# Patient Record
Sex: Female | Born: 1970 | Race: White | Hispanic: No | Marital: Married | Smoking: Never smoker
Health system: Southern US, Community
[De-identification: ages and names within clinical notes are randomized; demographics above are authoritative.]

## PROBLEM LIST (undated history)

## (undated) DIAGNOSIS — E039 Hypothyroidism, unspecified: Secondary | ICD-10-CM

## (undated) DIAGNOSIS — F319 Bipolar disorder, unspecified: Secondary | ICD-10-CM

## (undated) HISTORY — PX: TONSILLECTOMY AND ADENOIDECTOMY: SUR1326

---

## 2017-10-14 ENCOUNTER — Encounter: Payer: Self-pay | Admitting: Emergency Medicine

## 2017-10-14 ENCOUNTER — Other Ambulatory Visit: Payer: Self-pay

## 2017-10-14 ENCOUNTER — Emergency Department
Admission: EM | Admit: 2017-10-14 | Discharge: 2017-10-14 | Disposition: A | Discharge status: Home / Self Care | Payer: BLUE CROSS/BLUE SHIELD | Attending: Emergency Medicine | Admitting: Emergency Medicine

## 2017-10-14 DIAGNOSIS — R531 Weakness: Secondary | ICD-10-CM | POA: Diagnosis present

## 2017-10-14 DIAGNOSIS — E039 Hypothyroidism, unspecified: Secondary | ICD-10-CM | POA: Diagnosis not present

## 2017-10-14 LAB — URINALYSIS, COMPLETE (UACMP) WITH MICROSCOPIC
BILIRUBIN URINE: NEGATIVE
Glucose, UA: NEGATIVE mg/dL
HGB URINE DIPSTICK: NEGATIVE
KETONES UR: 5 mg/dL — AB
LEUKOCYTES UA: NEGATIVE
NITRITE: NEGATIVE
PH: 5 (ref 5.0–8.0)
Protein, ur: NEGATIVE mg/dL
SPECIFIC GRAVITY, URINE: 1.027 (ref 1.005–1.030)

## 2017-10-14 LAB — COMPREHENSIVE METABOLIC PANEL
ALT: 21 U/L (ref 14–54)
ANION GAP: 10 (ref 5–15)
AST: 31 U/L (ref 15–41)
Albumin: 4.1 g/dL (ref 3.5–5.0)
Alkaline Phosphatase: 69 U/L (ref 38–126)
BUN: 20 mg/dL (ref 6–20)
CALCIUM: 9.4 mg/dL (ref 8.9–10.3)
CHLORIDE: 100 mmol/L — AB (ref 101–111)
CO2: 26 mmol/L (ref 22–32)
CREATININE: 0.81 mg/dL (ref 0.44–1.00)
Glucose, Bld: 125 mg/dL — ABNORMAL HIGH (ref 65–99)
Potassium: 4.7 mmol/L (ref 3.5–5.1)
SODIUM: 136 mmol/L (ref 135–145)
Total Bilirubin: 0.8 mg/dL (ref 0.3–1.2)
Total Protein: 7.2 g/dL (ref 6.5–8.1)

## 2017-10-14 LAB — CBC
HCT: 37.7 % (ref 35.0–47.0)
Hemoglobin: 13.5 g/dL (ref 12.0–16.0)
MCH: 30.6 pg (ref 26.0–34.0)
MCHC: 35.8 g/dL (ref 32.0–36.0)
MCV: 85.3 fL (ref 80.0–100.0)
PLATELETS: 346 10*3/uL (ref 150–440)
RBC: 4.42 MIL/uL (ref 3.80–5.20)
RDW: 13.1 % (ref 11.5–14.5)
WBC: 13.7 10*3/uL — ABNORMAL HIGH (ref 3.6–11.0)

## 2017-10-14 LAB — TSH: TSH: 10.32 u[IU]/mL — ABNORMAL HIGH (ref 0.350–4.500)

## 2017-10-14 LAB — INFLUENZA PANEL BY PCR (TYPE A & B)
INFLAPCR: NEGATIVE
Influenza B By PCR: NEGATIVE

## 2017-10-14 NOTE — ED Triage Notes (Addendum)
Pt in with generalized weakness since Friday. States not able to drink or eat since then, states just loss of appetite. Pt denies any n,v,d or dysuria.

## 2017-10-14 NOTE — Discharge Instructions (Signed)
Your lab workup shows that you have hypothyroidism.  You should call your doctor tomorrow to schedule a follow-up appointment within the next 1-2 weeks so that you can get any other necessary workup to determine the cause, and to start treatment.  Return to the emergency department immediately for new, worsening, or persistent weakness, lightheadedness, passing out, difficulty breathing, chest pain, inability to hold anything down by mouth, or any other new or worsening symptoms that concern you.

## 2017-10-14 NOTE — ED Provider Notes (Signed)
[facility] Emergency Department Provider Note ____________________________________________   First MD Initiated Contact with Patient 10/14/17 2048     (approximate)  I have reviewed the triage vital signs and the nursing notes.   HISTORY  Chief Complaint Weakness    HPI Lindsey Sexton is a 47 y.o. female with PMH of hypertension who presents with generalized weakness over the last 3-4 days, gradual onset, associated with decreased appetite, but not associated with any other specific symptoms.  Patient denies sick contacts or other recent illness.  She denies fever, vomiting, abdominal pain, chest pain, difficulty breathing, or other acute symptoms.  No recent changes in medications.  No past medical history on file.  There are no active problems to display for this patient.    Prior to Admission medications   Not on File    Allergies Patient has no known allergies.  No family history on file.  Social History Social History   Tobacco Use  . Smoking status: Not on file  Substance Use Topics  . Alcohol use: Not on file  . Drug use: Not on file    Review of Systems  Constitutional: Positive for generalized weakness. Eyes: No redness. ENT: No sore throat. Cardiovascular: Denies chest pain. Respiratory: Denies shortness of breath. Gastrointestinal: No nausea, no vomiting.  No diarrhea.  Genitourinary: Negative for dysuria.  Musculoskeletal: Negative for back pain. Skin: Negative for rash. Neurological: Negative for headache.   ____________________________________________   PHYSICAL EXAM:  VITAL SIGNS: ED Triage Vitals  Enc Vitals Group     BP 10/14/17 1956 (!) 141/82     Pulse Rate 10/14/17 1956 91     Resp 10/14/17 1956 18     Temp 10/14/17 1956 98.6 F (37 C)     Temp Source 10/14/17 1956 Oral     SpO2 10/14/17 1956 100 %     Weight 10/14/17 1958 190 lb (86.2 kg)     Height 10/14/17 1958 5\' 5"  (1.651 m)     Head  Circumference --      Peak Flow --      Pain Score 10/14/17 2033 6     Pain Loc --      Pain Edu? --      Excl. in GC? --     Constitutional: Alert and oriented. Well appearing and in no acute distress. Eyes: Conjunctivae are normal.  EOMI. Head: Atraumatic. Nose: No congestion/rhinnorhea. Mouth/Throat: Mucous membranes are moist.   Neck: Normal range of motion.  Cardiovascular: Normal rate, regular rhythm. Grossly normal heart sounds.  Good peripheral circulation. Respiratory: Normal respiratory effort.  No retractions. Lungs CTAB. Gastrointestinal: Soft and nontender. No distention.  Genitourinary: No flank tenderness. Musculoskeletal: Bilateral mild non-pitting lower extremity edema.  Extremities warm and well perfused.  Neurologic:  Normal speech and language. No gross focal neurologic deficits are appreciated.  Skin:  Skin is warm and dry. No rash noted. Psychiatric: Mood and affect are normal. Speech and behavior are normal.  ____________________________________________   LABS (all labs ordered are listed, but only abnormal results are displayed)  Labs Reviewed  CBC - Abnormal; Notable for the following components:      Result Value   WBC 13.7 (*)    All other components within normal limits  COMPREHENSIVE METABOLIC PANEL - Abnormal; Notable for the following components:   Chloride 100 (*)    Glucose, Bld 125 (*)    All other components within normal limits  URINALYSIS, COMPLETE (UACMP) WITH MICROSCOPIC -  Abnormal; Notable for the following components:   Color, Urine AMBER (*)    APPearance HAZY (*)    Ketones, ur 5 (*)    Bacteria, UA RARE (*)    Squamous Epithelial / LPF 0-5 (*)    All other components within normal limits  TSH - Abnormal; Notable for the following components:   TSH 10.320 (*)    All other components within normal limits  INFLUENZA PANEL BY PCR (TYPE A & B)    ____________________________________________  EKG   ____________________________________________  RADIOLOGY    ____________________________________________   PROCEDURES  Procedure(s) performed: No  Procedures  Critical Care performed: No ____________________________________________   INITIAL IMPRESSION / ASSESSMENT AND PLAN / ED COURSE  Pertinent labs & imaging results that were available during my care of the patient were reviewed by me and considered in my medical decision making (see chart for details).  47 year old female with history of hypertension presents with generalized weakness/malaise over the last 3-4 days, with decreased appetite but no other focal symptoms.  No relevant past medical records available in Epic.  On exam, vital signs are normal, the patient is well-appearing, and the remainder the exam is unremarkable.  There is no clinical evidence for dehydration.  Initial labs reveal an elevated white blood cell count but are otherwise unremarkable.  Differential includes most likely nonspecific viral infection, influenza, or less likely endocrine cause such as hypothyroidism.  There is no evidence of anemia, electrolyte abnormality, or other metabolic cause.  No clinical evidence for cardiac cause or indication for cardiac workup.  We will add on a TSH and influenza swab and reassess.  Anticipate discharge home if negative.    ----------------------------------------- 11:00 PM on 10/14/2017 -----------------------------------------  Patient's flu is negative but her TSH is elevated, suggesting hypothyroidism which would correspond to the patient's symptoms.  At this time, given that the patient is stable and there is no evidence of a hypothyroid crisis, there is no indication to start acute treatment.  Patient states that she has a primary care doctor and will be able to schedule an appointment without difficulty.  Therefore I instructed her to call  tomorrow and schedule an appointment.  This will facilitate the patient getting any other necessary workup to determine the cause as an outpatient, and then initiate the appropriate treatment.  I discussed the results of the workup and the plan of care with the patient, as well as return precautions; she expressed understanding and agreement.  ____________________________________________   FINAL CLINICAL IMPRESSION(S) / ED DIAGNOSES  Final diagnoses:  Hypothyroidism, unspecified type      NEW MEDICATIONS STARTED DURING THIS VISIT:  New Prescriptions   No medications on file     Note:  This document was prepared using Dragon voice recognition software and may include unintentional dictation errors.    Dionne Bucy, MD 10/14/17 2302

## 2018-03-03 ENCOUNTER — Encounter: Payer: Self-pay | Admitting: Emergency Medicine

## 2018-03-03 ENCOUNTER — Emergency Department: Payer: BLUE CROSS/BLUE SHIELD

## 2018-03-03 ENCOUNTER — Observation Stay
Admission: EM | Admit: 2018-03-03 | Discharge: 2018-03-04 | Disposition: A | Discharge status: Home / Self Care | Payer: BLUE CROSS/BLUE SHIELD | Attending: Internal Medicine | Admitting: Internal Medicine

## 2018-03-03 DIAGNOSIS — R410 Disorientation, unspecified: Secondary | ICD-10-CM | POA: Insufficient documentation

## 2018-03-03 DIAGNOSIS — F319 Bipolar disorder, unspecified: Secondary | ICD-10-CM

## 2018-03-03 DIAGNOSIS — F23 Brief psychotic disorder: Secondary | ICD-10-CM | POA: Diagnosis not present

## 2018-03-03 DIAGNOSIS — E860 Dehydration: Secondary | ICD-10-CM | POA: Insufficient documentation

## 2018-03-03 DIAGNOSIS — E039 Hypothyroidism, unspecified: Secondary | ICD-10-CM | POA: Insufficient documentation

## 2018-03-03 DIAGNOSIS — R4182 Altered mental status, unspecified: Secondary | ICD-10-CM | POA: Diagnosis present

## 2018-03-03 DIAGNOSIS — F3132 Bipolar disorder, current episode depressed, moderate: Secondary | ICD-10-CM | POA: Diagnosis not present

## 2018-03-03 DIAGNOSIS — Z79899 Other long term (current) drug therapy: Secondary | ICD-10-CM | POA: Insufficient documentation

## 2018-03-03 DIAGNOSIS — F312 Bipolar disorder, current episode manic severe with psychotic features: Secondary | ICD-10-CM | POA: Diagnosis not present

## 2018-03-03 DIAGNOSIS — N390 Urinary tract infection, site not specified: Secondary | ICD-10-CM | POA: Diagnosis not present

## 2018-03-03 DIAGNOSIS — I10 Essential (primary) hypertension: Secondary | ICD-10-CM | POA: Insufficient documentation

## 2018-03-03 DIAGNOSIS — F29 Unspecified psychosis not due to a substance or known physiological condition: Secondary | ICD-10-CM

## 2018-03-03 HISTORY — DX: Bipolar disorder, unspecified: F31.9

## 2018-03-03 HISTORY — DX: Hypothyroidism, unspecified: E03.9

## 2018-03-03 LAB — COMPREHENSIVE METABOLIC PANEL
ALBUMIN: 4.3 g/dL (ref 3.5–5.0)
ALK PHOS: 69 U/L (ref 38–126)
ALT: 19 U/L (ref 0–44)
AST: 21 U/L (ref 15–41)
Anion gap: 10 (ref 5–15)
BUN: 18 mg/dL (ref 6–20)
CO2: 24 mmol/L (ref 22–32)
CREATININE: 0.78 mg/dL (ref 0.44–1.00)
Calcium: 9.4 mg/dL (ref 8.9–10.3)
Chloride: 101 mmol/L (ref 98–111)
GFR calc Af Amer: >60 mL/min (ref >60)
GFR calc non Af Amer: >60 mL/min (ref >60)
GLUCOSE: 102 mg/dL — AB (ref 70–99)
Potassium: 4 mmol/L (ref 3.5–5.1)
SODIUM: 135 mmol/L (ref 135–145)
Total Bilirubin: 0.6 mg/dL (ref 0.3–1.2)
Total Protein: 7.6 g/dL (ref 6.5–8.1)

## 2018-03-03 LAB — SALICYLATE LEVEL: Salicylate Lvl: <7 mg/dL (ref 2.8–30.0)

## 2018-03-03 LAB — CBC
HEMATOCRIT: 39.7 % (ref 35.0–47.0)
HEMOGLOBIN: 13.7 g/dL (ref 12.0–16.0)
MCH: 30.3 pg (ref 26.0–34.0)
MCHC: 34.5 g/dL (ref 32.0–36.0)
MCV: 88 fL (ref 80.0–100.0)
Platelets: 444 10*3/uL — ABNORMAL HIGH (ref 150–440)
RBC: 4.51 MIL/uL (ref 3.80–5.20)
RDW: 14.4 % (ref 11.5–14.5)
WBC: 11.1 10*3/uL — ABNORMAL HIGH (ref 3.6–11.0)

## 2018-03-03 LAB — T4, FREE: FREE T4: 0.73 ng/dL — AB (ref 0.82–1.77)

## 2018-03-03 LAB — ETHANOL: Alcohol, Ethyl (B): <10 mg/dL (ref <10)

## 2018-03-03 LAB — TSH: TSH: 3.878 u[IU]/mL (ref 0.350–4.500)

## 2018-03-03 LAB — ACETAMINOPHEN LEVEL: Acetaminophen (Tylenol), Serum: <10 ug/mL — ABNORMAL LOW (ref 10–30)

## 2018-03-03 NOTE — ED Triage Notes (Addendum)
Pt brought in to ED with husband due to pt having manic episode of unexplained word rambling. Pt in triage with word salad. Pt is calm and cooperative. Pt seen in March for similar situation after staying up for several days working and was diagnosed and admitted with hypothyroidism (TSH over 10000). Pt has no prior hx/o psychiatric diagnosis.

## 2018-03-04 ENCOUNTER — Inpatient Hospital Stay
Admission: AD | Admit: 2018-03-04 | Discharge: 2018-03-10 | DRG: 885 | Disposition: A | Discharge status: Home / Self Care | Payer: BLUE CROSS/BLUE SHIELD | Source: Intra-hospital | Attending: Psychiatry | Admitting: Psychiatry

## 2018-03-04 ENCOUNTER — Emergency Department: Payer: BLUE CROSS/BLUE SHIELD

## 2018-03-04 ENCOUNTER — Encounter: Payer: Self-pay | Admitting: Internal Medicine

## 2018-03-04 ENCOUNTER — Other Ambulatory Visit: Payer: Self-pay

## 2018-03-04 DIAGNOSIS — Z79899 Other long term (current) drug therapy: Secondary | ICD-10-CM | POA: Diagnosis not present

## 2018-03-04 DIAGNOSIS — E039 Hypothyroidism, unspecified: Secondary | ICD-10-CM | POA: Diagnosis present

## 2018-03-04 DIAGNOSIS — N390 Urinary tract infection, site not specified: Secondary | ICD-10-CM | POA: Diagnosis present

## 2018-03-04 DIAGNOSIS — F312 Bipolar disorder, current episode manic severe with psychotic features: Principal | ICD-10-CM | POA: Diagnosis present

## 2018-03-04 DIAGNOSIS — I10 Essential (primary) hypertension: Secondary | ICD-10-CM | POA: Diagnosis present

## 2018-03-04 DIAGNOSIS — F29 Unspecified psychosis not due to a substance or known physiological condition: Secondary | ICD-10-CM

## 2018-03-04 DIAGNOSIS — F3132 Bipolar disorder, current episode depressed, moderate: Secondary | ICD-10-CM | POA: Diagnosis not present

## 2018-03-04 DIAGNOSIS — F319 Bipolar disorder, unspecified: Secondary | ICD-10-CM

## 2018-03-04 DIAGNOSIS — R410 Disorientation, unspecified: Secondary | ICD-10-CM | POA: Diagnosis present

## 2018-03-04 LAB — URINE DRUG SCREEN, QUALITATIVE (ARMC ONLY)
AMPHETAMINES, UR SCREEN: NOT DETECTED
Benzodiazepine, Ur Scrn: POSITIVE — AB
CANNABINOID 50 NG, UR ~~LOC~~: NOT DETECTED
COCAINE METABOLITE, UR ~~LOC~~: NOT DETECTED
MDMA (Ecstasy)Ur Screen: NOT DETECTED
METHADONE SCREEN, URINE: NOT DETECTED
OPIATE, UR SCREEN: NOT DETECTED
Phencyclidine (PCP) Ur S: NOT DETECTED
Tricyclic, Ur Screen: POSITIVE — AB

## 2018-03-04 LAB — URINALYSIS, COMPLETE (UACMP) WITH MICROSCOPIC
Bilirubin Urine: NEGATIVE
Glucose, UA: NEGATIVE mg/dL
Hgb urine dipstick: NEGATIVE
KETONES UR: 80 mg/dL — AB
Nitrite: NEGATIVE
PROTEIN: 100 mg/dL — AB
Specific Gravity, Urine: 1.026 (ref 1.005–1.030)
WBC, UA: >50 WBC/hpf — ABNORMAL HIGH (ref 0–5)
pH: 5 (ref 5.0–8.0)

## 2018-03-04 LAB — POCT PREGNANCY, URINE: PREG TEST UR: NEGATIVE

## 2018-03-04 MED ORDER — ACETAMINOPHEN 325 MG PO TABS: 650.0000 mg | ORAL_TABLET | Freq: Four times a day (QID) | ORAL | Status: DC | PRN

## 2018-03-04 MED ORDER — SERTRALINE HCL 50 MG PO TABS
50.0000 mg | ORAL_TABLET | Freq: Every day | ORAL | Status: DC
  Administered 2018-03-04: 50 mg via ORAL
  Filled 2018-03-04: qty 1

## 2018-03-04 MED ORDER — SODIUM CHLORIDE 0.9 % IV SOLN
INTRAVENOUS | Status: DC
  Administered 2018-03-04: 11:00:00 via INTRAVENOUS

## 2018-03-04 MED ORDER — QUETIAPINE FUMARATE 100 MG PO TABS: 100.0000 mg | ORAL_TABLET | Freq: Every day | ORAL | Status: DC

## 2018-03-04 MED ORDER — AMLODIPINE BESYLATE 5 MG PO TABS
10.0000 mg | ORAL_TABLET | Freq: Every day | ORAL | Status: DC
  Administered 2018-03-05 – 2018-03-10 (×6): 10 mg via ORAL
  Filled 2018-03-04 (×6): qty 2

## 2018-03-04 MED ORDER — DIAZEPAM 5 MG PO TABS: 10.0000 mg | ORAL_TABLET | Freq: Every evening | ORAL | Status: DC | PRN

## 2018-03-04 MED ORDER — IRBESARTAN 150 MG PO TABS
300.0000 mg | ORAL_TABLET | Freq: Every day | ORAL | Status: DC
  Administered 2018-03-04: 300 mg via ORAL
  Filled 2018-03-04: qty 2

## 2018-03-04 MED ORDER — CEPHALEXIN 500 MG PO CAPS: 500.0000 mg | ORAL_CAPSULE | Freq: Two times a day (BID) | ORAL | Status: DC

## 2018-03-04 MED ORDER — IRBESARTAN 150 MG PO TABS
300.0000 mg | ORAL_TABLET | Freq: Every day | ORAL | Status: DC
  Administered 2018-03-05 – 2018-03-10 (×6): 300 mg via ORAL
  Filled 2018-03-04 (×6): qty 2

## 2018-03-04 MED ORDER — SODIUM CHLORIDE 0.9 % IV SOLN
1.0000 g | Freq: Once | INTRAVENOUS | Status: AC
  Administered 2018-03-04: 1 g via INTRAVENOUS

## 2018-03-04 MED ORDER — MAGNESIUM HYDROXIDE 400 MG/5ML PO SUSP: 30.0000 mL | Freq: Every day | ORAL | Status: DC | PRN

## 2018-03-04 MED ORDER — ONDANSETRON HCL 4 MG PO TABS: 4.0000 mg | ORAL_TABLET | Freq: Four times a day (QID) | ORAL | Status: DC | PRN

## 2018-03-04 MED ORDER — AMLODIPINE BESYLATE 10 MG PO TABS
10.0000 mg | ORAL_TABLET | Freq: Every day | ORAL | Status: DC
  Administered 2018-03-04: 11:00:00 10 mg via ORAL
  Filled 2018-03-04: qty 1

## 2018-03-04 MED ORDER — SODIUM CHLORIDE 0.9 % IV SOLN
INTRAVENOUS | Status: AC
  Administered 2018-03-04: 1 g via INTRAVENOUS
  Filled 2018-03-04: qty 10

## 2018-03-04 MED ORDER — DOCUSATE SODIUM 100 MG PO CAPS
100.0000 mg | ORAL_CAPSULE | Freq: Two times a day (BID) | ORAL | Status: DC
  Administered 2018-03-04: 11:00:00 100 mg via ORAL
  Filled 2018-03-04: qty 1

## 2018-03-04 MED ORDER — QUETIAPINE FUMARATE 100 MG PO TABS
100.0000 mg | ORAL_TABLET | Freq: Every day | ORAL | Status: DC
  Administered 2018-03-04 – 2018-03-05 (×2): 100 mg via ORAL
  Filled 2018-03-04 (×2): qty 1

## 2018-03-04 MED ORDER — ALUM & MAG HYDROXIDE-SIMETH 200-200-20 MG/5ML PO SUSP: 30.0000 mL | ORAL | Status: DC | PRN

## 2018-03-04 MED ORDER — ACETAMINOPHEN 650 MG RE SUPP: 650.0000 mg | Freq: Four times a day (QID) | RECTAL | Status: DC | PRN

## 2018-03-04 MED ORDER — SODIUM CHLORIDE 0.9 % IV SOLN
1.0000 g | INTRAVENOUS | Status: DC
  Filled 2018-03-04: qty 10

## 2018-03-04 MED ORDER — AMLODIPINE-OLMESARTAN 10-40 MG PO TABS: 1.0000 | ORAL_TABLET | Freq: Every day | ORAL | Status: DC

## 2018-03-04 MED ORDER — QUETIAPINE FUMARATE 25 MG PO TABS: 100.0000 mg | ORAL_TABLET | Freq: Every day | ORAL | Status: DC

## 2018-03-04 MED ORDER — DOCUSATE SODIUM 100 MG PO CAPS
100.0000 mg | ORAL_CAPSULE | Freq: Two times a day (BID) | ORAL | Status: DC
  Administered 2018-03-04 – 2018-03-05 (×2): 100 mg via ORAL
  Filled 2018-03-04 (×2): qty 1

## 2018-03-04 MED ORDER — ENOXAPARIN SODIUM 40 MG/0.4ML ~~LOC~~ SOLN
40.0000 mg | SUBCUTANEOUS | Status: DC
  Administered 2018-03-04: 40 mg via SUBCUTANEOUS
  Filled 2018-03-04: qty 0.4

## 2018-03-04 MED ORDER — ONDANSETRON HCL 4 MG/2ML IJ SOLN: 4.0000 mg | Freq: Four times a day (QID) | INTRAMUSCULAR | Status: DC | PRN

## 2018-03-04 MED ORDER — TEMAZEPAM 15 MG PO CAPS: 15.0000 mg | ORAL_CAPSULE | Freq: Once | ORAL | Status: DC

## 2018-03-04 MED ORDER — ACETAMINOPHEN 325 MG PO TABS
650.0000 mg | ORAL_TABLET | Freq: Four times a day (QID) | ORAL | Status: DC | PRN
  Administered 2018-03-06: 650 mg via ORAL
  Filled 2018-03-04: qty 2

## 2018-03-04 NOTE — Progress Notes (Signed)
Pt discharged to behavioral health in wheelchair.  Family remained at bedside until patient left the floor.

## 2018-03-04 NOTE — BH Assessment (Signed)
Patient is to be [facility] by Dr. Toni Amendlapacs Attending Physician will be Dr. Flora Lipps'Neal.   Patient [facility] room 307[facility].   Intake Paper Work has been signed and placed on patient chart.   Dr. Shaune PollackQing Chen: 1-C MD   Amy: Patient's Nurse   Clarise CruzJalayah: Patient Access.

## 2018-03-04 NOTE — Discharge Summary (Signed)
[facility] [place] [facility]   PATIENT NAME: Lindsey Sexton    MR#:  [id number]  DATE OF BIRTH:  [birth year]  DATE OF ADMISSION:  03/03/2018   ADMITTING PHYSICIAN: Arnaldo NatalMichael S Diamond, MD  DATE OF DISCHARGE:  03/04/2018  PRIMARY CARE PHYSICIAN: System, Pcp Not In   ADMISSION DIAGNOSIS:  Bipolar affective disorder, currently depressed, moderate (HCC) [F31.32] Urinary tract infection without hematuria, site unspecified [N39.0] Altered mental status, unspecified altered mental status type [R41.82] DISCHARGE DIAGNOSIS:  Principal Problem:   Psychosis (HCC) Active Problems:   Confusion   Bipolar disorder (HCC)   Hypothyroidism  SECONDARY DIAGNOSIS:   Past Medical History:  Diagnosis Date  . Bipolar disorder (HCC)   . Hypothyroidism    HOSPITAL COURSE:  This is a 47 year old female admitted for confusion.   1.    Acute psychosis: Admit to psych unit when bed is available per Dr. Toni Amendlapacs. 2.  UTI: Present on admission.    She is treated with IV ceftriaxone, changed to Keflex p.o. for 3 more days, follow-up urine culture. 3.  Bipolar disorder: Follow-up psychiatry physician. 4.  Hypertension: New home hypertension medication. I discussed with Dr. Toni Amendlapacs. DISCHARGE CONDITIONS:  Medically stable, discharge to behavioral medicine unit today. CONSULTS OBTAINED:  Treatment Team:  Audery Amellapacs, John T, MD DRUG ALLERGIES:  No Known Allergies DISCHARGE MEDICATIONS:   Allergies as of 03/04/2018   No Known Allergies     Medication List    STOP taking these medications   diazepam 10 MG tablet Commonly known as:  VALIUM   sertraline 50 MG tablet Commonly known as:  ZOLOFT     TAKE these medications   amLODipine-olmesartan 10-40 MG tablet Commonly known as:  AZOR Take 1 tablet by mouth daily.   cephALEXin 500 MG capsule Commonly known as:  KEFLEX Take 1 capsule (500 mg total) by mouth every 12 (twelve) hours for 4 days.   QUEtiapine 100 MG  tablet Commonly known as:  SEROQUEL Take 1 tablet (100 mg total) by mouth at bedtime.        DISCHARGE INSTRUCTIONS:  See AVS.  If you experience worsening of your admission symptoms, develop shortness of breath, life threatening emergency, suicidal or homicidal thoughts you must seek medical attention immediately by calling 911 or calling your MD immediately  if symptoms less severe.  You Must read complete instructions/literature along with all the possible adverse reactions/side effects for all the Medicines you take and that have been prescribed to you. Take any new Medicines after you have completely understood and accpet all the possible adverse reactions/side effects.   Please note  You were cared for by a hospitalist during your hospital stay. If you have any questions about your discharge medications or the care you received while you were in the hospital after you are discharged, you can call the unit and asked to speak with the hospitalist on call if the hospitalist that took care of you is not available. Once you are discharged, your primary care physician will handle any further medical issues. Please note that NO REFILLS for any discharge medications will be authorized once you are discharged, as it is imperative that you return to your primary care physician (or establish a relationship with a primary care physician if you do not have one) for your aftercare needs so that they can reassess your need for medications and monitor your lab values.    On the day of Discharge:  VITAL SIGNS:  Blood  pressure 134/81, pulse 75, temperature 98.4 F (36.9 C), temperature source Oral, resp. rate 16, height 5\' 6"  (1.676 m), weight 186 lb 3.2 oz (84.5 kg), SpO2 97 %. PHYSICAL EXAMINATION:  GENERAL:  47 y.o.-year-old patient lying in the bed with no acute distress.  EYES: Pupils equal, round, reactive to light and accommodation. No scleral icterus. Extraocular muscles intact.  HEENT: Head  atraumatic, normocephalic. Oropharynx and nasopharynx clear.  NECK:  Supple, no jugular venous distention. No thyroid enlargement, no tenderness.  LUNGS: Normal breath sounds bilaterally, no wheezing, rales,rhonchi or crepitation. No use of accessory muscles of respiration.  CARDIOVASCULAR: S1, S2 normal. No murmurs, rubs, or gallops.  ABDOMEN: Soft, non-tender, non-distended. Bowel sounds present. No organomegaly or mass.  EXTREMITIES: No pedal edema, cyanosis, or clubbing.  NEUROLOGIC: Cranial nerves II through XII are intact. Muscle strength 5/5 in all extremities. Sensation intact. Gait not checked.  PSYCHIATRIC: The patient is alert and oriented x 3.  She said she has depression and anxiety. SKIN: No obvious rash, lesion, or ulcer.  DATA REVIEW:   CBC Recent Labs  Lab 03/03/18 2223  WBC 11.1*  HGB 13.7  HCT 39.7  PLT 444*    Chemistries  Recent Labs  Lab 03/03/18 2223  NA 135  K 4.0  CL 101  CO2 24  GLUCOSE 102*  BUN 18  CREATININE 0.78  CALCIUM 9.4  AST 21  ALT 19  ALKPHOS 69  BILITOT 0.6     Microbiology Results  No results found for this or any previous visit.  RADIOLOGY:  Dg Chest 2 View  Result Date: 03/04/2018 CLINICAL DATA:  47 y/o F; manic episode with unexplained rambling/word salad. EXAM: CHEST - 2 VIEW COMPARISON:  None. FINDINGS: The heart size and mediastinal contours are within normal limits. Both lungs are clear. The visualized skeletal structures are unremarkable. IMPRESSION: No active cardiopulmonary disease. Electronically Signed   By: Mitzi Hansen M.D.   On: 03/04/2018 00:45   Ct Head Wo Contrast  Result Date: 03/04/2018 CLINICAL DATA:  47 y/o  F; altered mental status. EXAM: CT HEAD WITHOUT CONTRAST TECHNIQUE: Contiguous axial images were obtained from the base of the skull through the vertex without intravenous contrast. COMPARISON:  None. FINDINGS: Brain: No evidence of acute infarction, hemorrhage, hydrocephalus, extra-axial  collection or mass lesion/mass effect. Vascular: No hyperdense vessel or unexpected calcification. Skull: Normal. Negative for fracture or focal lesion. Sinuses/Orbits: No acute finding. Other: None. IMPRESSION: Negative CT of the head. Electronically Signed   By: Mitzi Hansen M.D.   On: 03/04/2018 00:32     Management plans discussed with the patient, her husband and they are in agreement.  CODE STATUS: Full Code   TOTAL TIME TAKING CARE OF THIS PATIENT: 37 minutes.    Shaune Pollack M.D on 03/04/2018 at 4:38 PM  Between 7am to 6pm - Pager - [phone]  After 6pm go to [email or web address] - Social research officer, government  [facility]  Office  [phone]  CC: Primary care physician; System, Pcp Not In   Note: This dictation was prepared with Dragon dictation along with smaller phrase technology. Any transcriptional errors that result from this process are unintentional.

## 2018-03-04 NOTE — H&P (Signed)
Lindsey Sexton is an 47 y.o. female.   Chief Complaint: Manic behavior HPI: The patient with past medical history of bipolar disorder presents to the emergency department with her husband due to concern for erratic behavior and speech.  The patient has been easily distractible yet routinely revisits topics that have caught her attention.  She has been adding the phrase #or Google that to much of her speech.  Her husband reports a similar episode in February at which time she discovered that she was hypothyroid.  He had been unaware of the patient's psychiatric diagnoses until now (they have been married 7 years).  In the emergency department the patient was found to have urinary tract infection but her mental status did not improve following antibiotic therapy which prompted the emergency department staff called the hospitalist service for admission.  Past Medical History:  Diagnosis Date  . Bipolar disorder ([place])   . Hypothyroidism       Past Surgical History:  Procedure Laterality Date  . TONSILLECTOMY AND ADENOIDECTOMY      History reviewed. No pertinent family history.  Unknown as the patient will not share this information  Social History:  reports that she has never smoked. She has never used smokeless tobacco. She reports that she drank alcohol. She reports that she has current or past drug history.  Allergies: No Known Allergies  Prior to Admission medications   Medication Sig Start Date End Date Taking? Authorizing Provider  amLODipine-olmesartan (AZOR) 10-40 MG tablet Take 1 tablet by mouth daily.   Yes [provider]  diazepam (VALIUM) 10 MG tablet Take 10 mg by mouth at bedtime as needed for sleep.   Yes [provider]  sertraline (ZOLOFT) 50 MG tablet Take 50 mg by mouth daily.   Yes [provider]     Results for orders placed or performed during the hospital encounter of 03/03/18 (from the past 48 hour(s))  Urine Drug Screen, Qualitative      Status: Abnormal   Collection Time: 03/03/18  9:52 PM  Result Value Ref Range   Tricyclic, Ur Screen POSITIVE (A) NONE DETECTED   Amphetamines, Ur Screen NONE DETECTED NONE DETECTED   MDMA (Ecstasy)Ur Screen NONE DETECTED NONE DETECTED   Cocaine Metabolite,Ur [place] NONE DETECTED NONE DETECTED   Opiate, Ur Screen NONE DETECTED NONE DETECTED   Phencyclidine (PCP) Ur S NONE DETECTED NONE DETECTED   Cannabinoid 50 Ng, Ur [place] NONE DETECTED NONE DETECTED   Barbiturates, Ur Screen (A) NONE DETECTED    Result not available. Reagent lot number recalled by manufacturer.   Benzodiazepine, Ur Scrn POSITIVE (A) NONE DETECTED   Methadone Scn, Ur NONE DETECTED NONE DETECTED    Comment: (NOTE) Tricyclics + metabolites, urine    Cutoff 1000 ng/mL Amphetamines + metabolites, urine  Cutoff 1000 ng/mL MDMA (Ecstasy), urine              Cutoff 500 ng/mL Cocaine Metabolite, urine          Cutoff 300 ng/mL Opiate + metabolites, urine        Cutoff 300 ng/mL Phencyclidine (PCP), urine         Cutoff 25 ng/mL Cannabinoid, urine                 Cutoff 50 ng/mL Barbiturates + metabolites, urine  Cutoff 200 ng/mL Benzodiazepine, urine              Cutoff 200 ng/mL Methadone, urine  Cutoff 300 ng/mL The urine drug screen provides only a preliminary, unconfirmed analytical test result and should not be used for non-medical purposes. Clinical consideration and professional judgment should be applied to any positive drug screen result due to possible interfering substances. A more specific alternate chemical method must be used in order to obtain a confirmed analytical result. Gas chromatography / mass spectrometry (GC/MS) is the preferred confirmat ory method. Performed at [facility], [address] [place], [place] [address]   Urinalysis, Complete w Microscopic     Status: Abnormal   Collection Time: 03/03/18  9:52 PM  Result Value Ref Range   Color, Urine YELLOW (A) YELLOW    APPearance CLOUDY (A) CLEAR   Specific Gravity, Urine 1.026 1.005 - 1.030   pH 5.0 5.0 - 8.0   Glucose, UA NEGATIVE NEGATIVE mg/dL   Hgb urine dipstick NEGATIVE NEGATIVE   Bilirubin Urine NEGATIVE NEGATIVE   Ketones, ur 80 (A) NEGATIVE mg/dL   Protein, ur 100 (A) NEGATIVE mg/dL   Nitrite NEGATIVE NEGATIVE   Leukocytes, UA LARGE (A) NEGATIVE   RBC / HPF 11-20 0 - 5 RBC/hpf   WBC, UA >50 (H) 0 - 5 WBC/hpf   Bacteria, UA RARE (A) NONE SEEN   Squamous Epithelial / LPF 21-50 0 - 5   Mucus PRESENT     Comment: Performed at [facility], [address], [place], [place] [address]  Comprehensive metabolic panel     Status: Abnormal   Collection Time: 03/03/18 10:23 PM  Result Value Ref Range   Sodium 135 135 - 145 mmol/L   Potassium 4.0 3.5 - 5.1 mmol/L   Chloride 101 98 - 111 mmol/L    Comment: Please note change in reference range.   CO2 24 22 - 32 mmol/L   Glucose, Bld 102 (H) 70 - 99 mg/dL    Comment: Please note change in reference range.   BUN 18 6 - 20 mg/dL    Comment: Please note change in reference range.   Creatinine, Ser 0.78 0.44 - 1.00 mg/dL   Calcium 9.4 8.9 - 10.3 mg/dL   Total Protein 7.6 6.5 - 8.1 g/dL   Albumin 4.3 3.5 - 5.0 g/dL   AST 21 15 - 41 U/L   ALT 19 0 - 44 U/L    Comment: Please note change in reference range.   Alkaline Phosphatase 69 38 - 126 U/L   Total Bilirubin 0.6 0.3 - 1.2 mg/dL   GFR calc non Af Amer >60 >60 mL/min   GFR calc Af Amer >60 >60 mL/min    Comment: (NOTE) The eGFR has been calculated using the CKD EPI equation. This calculation has not been validated in all clinical situations. eGFR's persistently <60 mL/min signify possible Chronic Kidney Disease.    Anion gap 10 5 - 15    Comment: Performed at [facility], [address] [place], [place] [address]  Ethanol     Status: None   Collection Time: 03/03/18 10:23 PM  Result Value Ref Range   Alcohol, Ethyl (B) <10 <10 mg/dL    Comment: (NOTE) Lowest  detectable limit for serum alcohol is 10 mg/dL. For medical purposes only. Performed at [facility], [place], [place] [address]   Salicylate level     Status: None   Collection Time: 03/03/18 10:23 PM  Result Value Ref Range   Salicylate Lvl <1.4 2.8 - 30.0 mg/dL    Comment: Performed at [facility], [place], [place] [address]  Acetaminophen level     Status: Abnormal   Collection Time: 03/03/18 10:23 PM  Result Value Ref Range   Acetaminophen (Tylenol), Serum <10 (L) 10 - 30 ug/mL    Comment: (NOTE) Therapeutic concentrations vary significantly. A range of 10-30 ug/mL  may be an effective concentration for many patients. However, some  are best treated at concentrations outside of this range. Acetaminophen concentrations >150 ug/mL at 4 hours after ingestion  and >50 ug/mL at 12 hours after ingestion are often associated with  toxic reactions. Performed at [facility], [address] [place], [place] [address]   cbc     Status: Abnormal   Collection Time: 03/03/18 10:23 PM  Result Value Ref Range   WBC 11.1 (H) 3.6 - 11.0 K/uL   RBC 4.51 3.80 - 5.20 MIL/uL   Hemoglobin 13.7 12.0 - 16.0 g/dL   HCT 39.7 35.0 - 47.0 %   MCV 88.0 80.0 - 100.0 fL   MCH 30.3 26.0 - 34.0 pg   MCHC 34.5 32.0 - 36.0 g/dL   RDW 14.4 11.5 - 14.5 %   Platelets 444 (H) 150 - 440 K/uL    Comment: Performed at [facility], [address] [place], [place] [address]  TSH     Status: None   Collection Time: 03/03/18 10:23 PM  Result Value Ref Range   TSH 3.878 0.350 - 4.500 uIU/mL    Comment: Performed by a 3rd Generation assay with a functional sensitivity of <=0.01 uIU/mL. Performed at [facility], [address] [place], [place] [address]   T4, free     Status: Abnormal   Collection Time: 03/03/18 10:23 PM  Result Value Ref Range   Free T4 0.73 (L) 0.82 - 1.77 ng/dL    Comment: (NOTE) Biotin ingestion may  interfere with free T4 tests. If the results are inconsistent with the TSH level, previous test results, or the clinical presentation, then consider biotin interference. If needed, order repeat testing after stopping biotin. Performed at [facility], [address] [place], [place] [address]   Pregnancy, urine POC     Status: None   Collection Time: 03/04/18 12:31 AM  Result Value Ref Range   Preg Test, Ur NEGATIVE NEGATIVE    Comment:        THE SENSITIVITY OF THIS METHODOLOGY IS >24 mIU/mL    Dg Chest 2 View  Result Date: 03/04/2018 CLINICAL DATA:  47 y/o F; manic episode with unexplained rambling/word salad. EXAM: CHEST - 2 VIEW COMPARISON:  None. FINDINGS: The heart size and mediastinal contours are within normal limits. Both lungs are clear. The visualized skeletal structures are unremarkable. IMPRESSION: No active cardiopulmonary disease. Electronically Signed   By: Kristine Garbe M.D.   On: 03/04/2018 00:45   Ct Head Wo Contrast  Result Date: 03/04/2018 CLINICAL DATA:  47 y/o  F; altered mental status. EXAM: CT HEAD WITHOUT CONTRAST TECHNIQUE: Contiguous axial images were obtained from the base of the skull through the vertex without intravenous contrast. COMPARISON:  None. FINDINGS: Brain: No evidence of acute infarction, hemorrhage, hydrocephalus, extra-axial collection or mass lesion/mass effect. Vascular: No hyperdense vessel or unexpected calcification. Skull: Normal. Negative for fracture or focal lesion. Sinuses/Orbits: No acute finding. Other: None. IMPRESSION: Negative CT of the head. Electronically Signed   By: Kristine Garbe M.D.   On: 03/04/2018 00:32    Review of Systems  Unable to perform ROS:  Mental status change    Blood pressure 137/84, pulse 75, temperature 98.6 F (37 C), temperature source Oral, resp. rate 18, weight 86.2 kg (190 lb), SpO2 98 %. Physical Exam  Vitals reviewed. Constitutional: She appears well-developed and  well-nourished. No distress.  HENT:  Head: Normocephalic and atraumatic.  Mouth/Throat: Oropharynx is clear and moist.  Eyes: Pupils are equal, round, and reactive to light. Conjunctivae and EOM are normal. No scleral icterus.  Neck: Normal range of motion. Neck supple. No JVD present. No tracheal deviation present. No thyromegaly present.  Cardiovascular: Normal rate, regular rhythm and normal heart sounds. Exam reveals no gallop and no friction rub.  No murmur heard. Respiratory: Effort normal and breath sounds normal.  GI: Soft. Bowel sounds are normal. She exhibits no distension. There is no tenderness.  Genitourinary:  Genitourinary Comments: Deferred  Musculoskeletal: Normal range of motion. She exhibits no edema.  Lymphadenopathy:    She has no cervical adenopathy.  Neurological: No cranial nerve deficit.  Patient will not interact/feigning sleep  Skin: Skin is warm and dry. No rash noted. No erythema.  Psychiatric:  Patient will not participate in MMSE     Assessment/Plan This is a 47 year old female admitted for confusion.   1.  Confusion: Multifactorial MI: Includes psychiatric illness as well as UTI and dehydration.  Continue to monitor. 2.  UTI: Present on admission.  Continue ceftriaxone 3.  Hypothyroidism: Resume Synthroid 4.  Bipolar disorder: Consult psychiatry.  Continue Zoloft and Valium as needed 5.  Hypertension: The patient has a prescription for Azor.  Resume if blood pressure is out of control 6.  DVT prophylaxis: Lovenox 7.  GI prophylaxis: None The patient is a full code.  Time spent on admission orders and patient care approximately 45 minutes  Harrie Foreman, MD 03/04/2018, 9:04 AM

## 2018-03-04 NOTE — Consult Note (Signed)
[facility] Consult   Reason for Consult: Consult for 47 year old woman brought to the hospital with confusion Referring Physician: Bridgett Larsson Patient Identification: Lindsey Sexton MRN:  [id number] Principal Diagnosis: Psychosis [facility]) Diagnosis:   Patient Active Problem List   Diagnosis Date Noted  . Confusion [R41.0] 03/04/2018  . Psychosis ([place]) [F29] 03/04/2018  . Bipolar disorder ([place]) [F31.9] 03/04/2018  . Hypothyroidism [E03.9] 03/04/2018    Total Time spent with patient: 1 hour  Subjective:   Lindsey Sexton is a 47 y.o. female patient admitted with patient not able to give any information.  HPI: Patient seen chart reviewed.  Spoke with the husband as well who was present.  They gave me the phone number for the patient's mother and I tried to reach her but had to leave a message.  47 year old woman who was in her normal state of health apparently until the middle of last week when her husband reports that she was starting to get a little more euphoric than usual but nothing that at the time seemed alarming.  Husband was out of town for several days and reports that as of Friday she still sounded normal on the telephone.  Evidently over the weekend patient became more and more agitated.  Did not sleep for at least one night perhaps for 2 nights.  When he came home at the end of the weekend he found her very confused often making no sense somewhat agitated.  Had spent the entire night cleaning the house in a manner that seemed very unusual.  No evidence of drug abuse but the patient had recently been restarted supposedly on phentermine for weight loss.  Over the next couple days the patient worsened in her mental condition to something like she is now where she is unable to make any sense at all.  They were referred by their primary care doctor to go to [facility] but [facility] [place] felt that the condition needed a medical workup and eventually the patient wound up in our  emergency room.  She was admitted to the medical service because she had signs of a urinary tract infection and there was concern about her thyroid.  On interview the patient is extremely disorganized.  Speaks in what is essentially a word salad.  Made almost no eye contact with me.  Seems to be hyper religious and some of the content of what she is talking about.  Anxious flat very distracted.  Unclear if she is hallucinating.  Unable to answer questions directly.  Social history: Patient is married has been with her husband about 7 years.  No children at home.  Medical history: No known medical problems reported.  Substance abuse history: No known history of substance abuse  Past Psychiatric History: Apparently the patient's mother has reported that the patient had once been diagnosed with bipolar disorder in the past.  This predated the patient's relationship with her husband who had no idea about any past psychiatric history.  Patient had not been on any medicine recently or getting any mental health treatment.  Patient acknowledges to me that she had been diagnosed with bipolar disorder but is unable to give any other information.  None of that shows up in our electronic record.  Risk to Self:   Risk to Others:   Prior Inpatient Therapy:   Prior Outpatient Therapy:    Past Medical History:  Past Medical History:  Diagnosis Date  . Bipolar disorder ([place])   . Hypothyroidism  Past Surgical History:  Procedure Laterality Date  . TONSILLECTOMY AND ADENOIDECTOMY     Family History: History reviewed. No pertinent family history. Family Psychiatric  History: None known Social History:  Social History   Substance and Sexual Activity  Alcohol Use Not Currently     Social History   Substance and Sexual Activity  Drug Use Not Currently    Social History   Socioeconomic History  . Marital status: Married    Spouse name: Not on file  . Number of children: Not on file  . Years of  education: Not on file  . Highest education level: Not on file  Occupational History  . Not on file  Social Needs  . Financial resource strain: Not on file  . Food insecurity:    Worry: Not on file    Inability: Not on file  . Transportation needs:    Medical: Not on file    Non-medical: Not on file  Tobacco Use  . Smoking status: Never Smoker  . Smokeless tobacco: Never Used  Substance and Sexual Activity  . Alcohol use: Not Currently  . Drug use: Not Currently  . Sexual activity: Not on file  Lifestyle  . Physical activity:    Days per week: Not on file    Minutes per session: Not on file  . Stress: Not on file  Relationships  . Social connections:    Talks on phone: Not on file    Gets together: Not on file    Attends religious service: Not on file    Active member of club or organization: Not on file    Attends meetings of clubs or organizations: Not on file    Relationship status: Not on file  Other Topics Concern  . Not on file  Social History Narrative  . Not on file   Additional Social History:    Allergies:  No Known Allergies  Labs:  Results for orders placed or performed during the hospital encounter of 03/03/18 (from the past 48 hour(s))  Urine Drug Screen, Qualitative     Status: Abnormal   Collection Time: 03/03/18  9:52 PM  Result Value Ref Range   Tricyclic, Ur Screen POSITIVE (A) NONE DETECTED   Amphetamines, Ur Screen NONE DETECTED NONE DETECTED   MDMA (Ecstasy)Ur Screen NONE DETECTED NONE DETECTED   Cocaine Metabolite,Ur [place] NONE DETECTED NONE DETECTED   Opiate, Ur Screen NONE DETECTED NONE DETECTED   Phencyclidine (PCP) Ur S NONE DETECTED NONE DETECTED   Cannabinoid 50 Ng, Ur [place] NONE DETECTED NONE DETECTED   Barbiturates, Ur Screen (A) NONE DETECTED    Result not available. Reagent lot number recalled by manufacturer.   Benzodiazepine, Ur Scrn POSITIVE (A) NONE DETECTED   Methadone Scn, Ur NONE DETECTED NONE DETECTED    Comment:  (NOTE) Tricyclics + metabolites, urine    Cutoff 1000 ng/mL Amphetamines + metabolites, urine  Cutoff 1000 ng/mL MDMA (Ecstasy), urine              Cutoff 500 ng/mL Cocaine Metabolite, urine          Cutoff 300 ng/mL Opiate + metabolites, urine        Cutoff 300 ng/mL Phencyclidine (PCP), urine         Cutoff 25 ng/mL Cannabinoid, urine                 Cutoff 50 ng/mL Barbiturates + metabolites, urine  Cutoff 200 ng/mL Benzodiazepine, urine  Cutoff 200 ng/mL Methadone, urine                   Cutoff 300 ng/mL The urine drug screen provides only a preliminary, unconfirmed analytical test result and should not be used for non-medical purposes. Clinical consideration and professional judgment should be applied to any positive drug screen result due to possible interfering substances. A more specific alternate chemical method must be used in order to obtain a confirmed analytical result. Gas chromatography / mass spectrometry (GC/MS) is the preferred confirmat ory method. Performed at [facility], [address] [place], [place] [address]   Urinalysis, Complete w Microscopic     Status: Abnormal   Collection Time: 03/03/18  9:52 PM  Result Value Ref Range   Color, Urine YELLOW (A) YELLOW   APPearance CLOUDY (A) CLEAR   Specific Gravity, Urine 1.026 1.005 - 1.030   pH 5.0 5.0 - 8.0   Glucose, UA NEGATIVE NEGATIVE mg/dL   Hgb urine dipstick NEGATIVE NEGATIVE   Bilirubin Urine NEGATIVE NEGATIVE   Ketones, ur 80 (A) NEGATIVE mg/dL   Protein, ur 100 (A) NEGATIVE mg/dL   Nitrite NEGATIVE NEGATIVE   Leukocytes, UA LARGE (A) NEGATIVE   RBC / HPF 11-20 0 - 5 RBC/hpf   WBC, UA >50 (H) 0 - 5 WBC/hpf   Bacteria, UA RARE (A) NONE SEEN   Squamous Epithelial / LPF 21-50 0 - 5   Mucus PRESENT     Comment: Performed at [facility], [address], [place], [place] [address]  Comprehensive metabolic panel     Status: Abnormal   Collection Time: 03/03/18 10:23  PM  Result Value Ref Range   Sodium 135 135 - 145 mmol/L   Potassium 4.0 3.5 - 5.1 mmol/L   Chloride 101 98 - 111 mmol/L    Comment: Please note change in reference range.   CO2 24 22 - 32 mmol/L   Glucose, Bld 102 (H) 70 - 99 mg/dL    Comment: Please note change in reference range.   BUN 18 6 - 20 mg/dL    Comment: Please note change in reference range.   Creatinine, Ser 0.78 0.44 - 1.00 mg/dL   Calcium 9.4 8.9 - 10.3 mg/dL   Total Protein 7.6 6.5 - 8.1 g/dL   Albumin 4.3 3.5 - 5.0 g/dL   AST 21 15 - 41 U/L   ALT 19 0 - 44 U/L    Comment: Please note change in reference range.   Alkaline Phosphatase 69 38 - 126 U/L   Total Bilirubin 0.6 0.3 - 1.2 mg/dL   GFR calc non Af Amer >60 >60 mL/min   GFR calc Af Amer >60 >60 mL/min    Comment: (NOTE) The eGFR has been calculated using the CKD EPI equation. This calculation has not been validated in all clinical situations. eGFR's persistently <60 mL/min signify possible Chronic Kidney Disease.    Anion gap 10 5 - 15    Comment: Performed at [facility], [address] [place], [place] [address]  Ethanol     Status: None   Collection Time: 03/03/18 10:23 PM  Result Value Ref Range   Alcohol, Ethyl (B) <10 <10 mg/dL    Comment: (NOTE) Lowest detectable limit for serum alcohol is 10 mg/dL. For medical purposes only. Performed at [facility], [address] [place], [place] [address]   Salicylate level     Status: None   Collection Time: 03/03/18 10:23  PM  Result Value Ref Range   Salicylate Lvl <4.0 2.8 - 30.0 mg/dL    Comment: Performed at [facility], [address] [place], [place] [address]  Acetaminophen level     Status: Abnormal   Collection Time: 03/03/18 10:23 PM  Result Value Ref Range   Acetaminophen (Tylenol), Serum <10 (L) 10 - 30 ug/mL    Comment: (NOTE) Therapeutic concentrations vary significantly. A range of 10-30 ug/mL  may be an effective concentration for many patients.  However, some  are best treated at concentrations outside of this range. Acetaminophen concentrations >150 ug/mL at 4 hours after ingestion  and >50 ug/mL at 12 hours after ingestion are often associated with  toxic reactions. Performed at [facility], [address] [place], [place] [address]   cbc     Status: Abnormal   Collection Time: 03/03/18 10:23 PM  Result Value Ref Range   WBC 11.1 (H) 3.6 - 11.0 K/uL   RBC 4.51 3.80 - 5.20 MIL/uL   Hemoglobin 13.7 12.0 - 16.0 g/dL   HCT 39.7 35.0 - 47.0 %   MCV 88.0 80.0 - 100.0 fL   MCH 30.3 26.0 - 34.0 pg   MCHC 34.5 32.0 - 36.0 g/dL   RDW 14.4 11.5 - 14.5 %   Platelets 444 (H) 150 - 440 K/uL    Comment: Performed at [facility], [address] [place], [place] [address]  TSH     Status: None   Collection Time: 03/03/18 10:23 PM  Result Value Ref Range   TSH 3.878 0.350 - 4.500 uIU/mL    Comment: Performed by a 3rd Generation assay with a functional sensitivity of <=0.01 uIU/mL. Performed at [facility], [address] [place], [place] [address]   T4, free     Status: Abnormal   Collection Time: 03/03/18 10:23 PM  Result Value Ref Range   Free T4 0.73 (L) 0.82 - 1.77 ng/dL    Comment: (NOTE) Biotin ingestion may interfere with free T4 tests. If the results are inconsistent with the TSH level, previous test results, or the clinical presentation, then consider biotin interference. If needed, order repeat testing after stopping biotin. Performed at [facility], [address] [place], [place] [address]   Pregnancy, urine POC     Status: None   Collection Time: 03/04/18 12:31 AM  Result Value Ref Range   Preg Test, Ur NEGATIVE NEGATIVE    Comment:        THE SENSITIVITY OF THIS METHODOLOGY IS >24 mIU/mL     Current Facility-Administered Medications  Medication Dose Route Frequency Provider Last Rate Last Dose  . 0.9 %  sodium chloride infusion   Intravenous Continuous Harrie Foreman, MD 125 mL/hr at 03/04/18 1054    . acetaminophen (TYLENOL) tablet 650 mg  650 mg Oral Q6H PRN Harrie Foreman, MD       Or  . acetaminophen (TYLENOL) suppository 650 mg  650 mg Rectal Q6H PRN Harrie Foreman, MD      . amLODipine (NORVASC) tablet 10 mg  10 mg Oral Daily Demetrios Loll, MD   10 mg at 03/04/18 1051   And  . irbesartan (AVAPRO) tablet 300 mg  300 mg Oral Daily Demetrios Loll, MD   300 mg at 03/04/18 1051  . cefTRIAXone (ROCEPHIN) 1 g in sodium chloride 0.9 % 100 mL IVPB  1 g Intravenous Q24H Harrie Foreman, MD      .  diazepam (VALIUM) tablet 10 mg  10 mg Oral QHS PRN Harrie Foreman, MD      . docusate sodium (COLACE) capsule 100 mg  100 mg Oral BID Harrie Foreman, MD   100 mg at 03/04/18 1050  . enoxaparin (LOVENOX) injection 40 mg  40 mg Subcutaneous Q24H Harrie Foreman, MD   40 mg at 03/04/18 1053  . ondansetron (ZOFRAN) tablet 4 mg  4 mg Oral Q6H PRN Harrie Foreman, MD       Or  . ondansetron [facility]) injection 4 mg  4 mg Intravenous Q6H PRN Harrie Foreman, MD      . sertraline (ZOLOFT) tablet 50 mg  50 mg Oral Daily Harrie Foreman, MD   50 mg at 03/04/18 1051    Musculoskeletal: Strength & Muscle Tone: within normal limits Gait & Station: normal Patient leans: N/A  Psychiatric Specialty Exam: Physical Exam  Nursing note and vitals reviewed. Constitutional: She appears well-developed and well-nourished.  HENT:  Head: Normocephalic and atraumatic.  Eyes: Pupils are equal, round, and reactive to light. Conjunctivae are normal.  Neck: Normal range of motion.  Cardiovascular: Regular rhythm and normal heart sounds.  Respiratory: Effort normal. No respiratory distress.  GI: Soft.  Musculoskeletal: Normal range of motion.  Neurological: She is alert.  Skin: Skin is warm and dry.  Psychiatric: Her affect is blunt and inappropriate. Her speech is tangential. She is agitated. She is not aggressive. Cognition and memory are impaired. She  expresses inappropriate judgment. She is noncommunicative. She is inattentive.    Review of Systems  Unable to perform ROS: Mental status change    Blood pressure 131/80, pulse 71, temperature 98.3 F (36.8 C), resp. rate 18, height '5\' 6"'$  (1.676 m), weight 84.5 kg (186 lb 3.2 oz), SpO2 98 %.Body mass index is 30.05 kg/m.  General Appearance: Casual  Eye Contact:  Minimal  Speech:  Garbled  Volume:  Decreased  Mood:  Anxious  Affect:  Constricted and Inappropriate  Thought Process:  Disorganized  Orientation:  Negative  Thought Content:  Illogical and Patient speech goes beyond illogical into being essentially word salad.  Suicidal Thoughts:  Husband reports that throughout all of this the patient has not made any statements or shown any behavior to indicate a tendency to self-harm  Homicidal Thoughts:  No  Memory:  Negative  Judgement:  Negative  Insight:  Negative  Psychomotor Activity:  Restlessness  Concentration:  Concentration: Poor  Recall:  [place] of Knowledge:  Negative  Language:  Negative  Akathisia:  Negative  Handed:  Right  AIMS (if indicated):     Assets:  Catering manager Housing Physical Health Resilience Social Support  ADL's:  Impaired  Cognition:  Impaired,  Mild  Sleep:        Treatment Plan Summary: Daily contact with patient to assess and evaluate symptoms and progress in treatment, Medication management and Plan This is a 47 year old woman with altered mental status.  She was admitted to the medical service out of concern that this could represent a delirium and a medical condition.  So far the workup has been unrevealing.  Seem to have a fairly normal urinary tract infection which is being treated.  Total thyroid level is very slightly low but her TSH is normal.  Head CT is entirely normal.  Drug screen positive for benzodiazepines and try cyclic's unclear what that means.  No sign of amphetamines or stimulants.  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  I am recommending that we start Seroquel beginning with 100 mg at night.  Patient would do well to be admitted to the psychiatric ward.  I presented this to the patient and her husband.  Not sure the patient would have capacity to sign right now.  We may need to file commitment papers just to make sure she can go downstairs.  I am waiting for a call back from TTS to discuss this.  Disposition: Recommend psychiatric Inpatient admission when medically cleared.  Alethia Berthold, MD 03/04/2018 2:19 PM

## 2018-03-04 NOTE — ED Provider Notes (Signed)
[facility] Emergency Department Provider Note   ____________________________________________   First MD Initiated Contact with Patient 03/03/18 2305     (approximate)  I have reviewed the triage vital signs and the nursing notes.   HISTORY  Chief Complaint Manic Behavior  Level 5 caveat: Majority of history provided by patient's spouse  HPI Lindsey Sexton is a 47 y.o. female brought to the ED from home by her husband with a chief complaint of "manic episode".  Husband reports he was out of town this weekend and came home Sunday to find that the patient had been cleaning the house obsessively for 12 to 15 hours straight.  He forced her to take a nap Sunday thinking that sleep would help her symptoms.  Reports she is having fixation and rambling of certain words like "Google" and "text" and "hash tag".  Reports a similar, although not as intense episode in February.  Thought to be due to patient's not taking her thyroid medications.  These were restarted and she improved.  Husband took her to her PCP today who felt like her symptoms were psychiatric and referred her to [facility] which is a detox facility.  She had an evaluation at [facility] who felt like her symptoms were medical and directed her to the ED for evaluation.  As far as the husband knows, patient has not had fever, chest pain, shortness of breath, abdominal pain, nausea, vomiting or diarrhea.  She had a fall where she struck her head 4 weeks ago but no injury since.  Patient does not take anticoagulants.  Husband states they have been married for 7 years and he did not know that she had bipolar disorder until he spoke with her mother this morning.  In the time that he has been married to her, husband states patient does not take psychiatric medications.   Past Medical History Hypertension Hypothyroidism  There are no active problems to display for this patient.   Prior to Admission  medications   Not on File    Allergies Patient has no known allergies.  No family history on file.  Social History Social History   Tobacco Use  . Smoking status: Never Smoker  . Smokeless tobacco: Never Used  Substance Use Topics  . Alcohol use: Not Currently  . Drug use: Not Currently    Review of Systems  Constitutional: Positive for generalized weakness. No fever/chills Eyes: No visual changes. ENT: No sore throat. Cardiovascular: Denies chest pain. Respiratory: Denies shortness of breath. Gastrointestinal: No abdominal pain.  No nausea, no vomiting.  No diarrhea.  No constipation. Genitourinary: Negative for dysuria. Musculoskeletal: Negative for back pain. Skin: Negative for rash. Neurological: Positive for confusion.  Negative for headaches, focal weakness or numbness. Psychiatric:Patient reports vague SI without plan  ____________________________________________   PHYSICAL EXAM:  VITAL SIGNS: ED Triage Vitals  Enc Vitals Group     BP 03/03/18 2217 (!) 161/94     Pulse Rate 03/03/18 2217 71     Resp 03/03/18 2217 18     Temp 03/03/18 2217 98.6 F (37 C)     Temp Source 03/03/18 2217 Oral     SpO2 03/03/18 2217 100 %     Weight 03/03/18 2218 190 lb (86.2 kg)     Height --      Head Circumference --      Peak Flow --      Pain Score --      Pain Loc --  Pain Edu? --      Excl. in GC? --     Constitutional: Drowsy but alert and oriented. Well appearing and in no acute distress. Eyes: Conjunctivae are normal. PERRL. EOMI. Head: Atraumatic. Nose: No congestion/rhinnorhea. Mouth/Throat: Mucous membranes are moist.  Oropharynx non-erythematous. Neck: No stridor.  No thyromegaly. Cardiovascular: Normal rate, regular rhythm. Grossly normal heart sounds.  Good peripheral circulation. Respiratory: Normal respiratory effort.  No retractions. Lungs CTAB. Gastrointestinal: Soft and nontender to light or deep palpation. No distention. No abdominal  bruits. No CVA tenderness. Musculoskeletal: No lower extremity tenderness nor edema.  No joint effusions. Neurologic:  Alert and oriented to person and place. Normal speech and language. No gross focal neurologic deficits are appreciated. Intermittently confused. Skin:  Skin is warm, dry and intact. No rash noted. Psychiatric: Mood and affect are flat. Speech and behavior are normal.  ____________________________________________   LABS (all labs ordered are listed, but only abnormal results are displayed)  Labs Reviewed  COMPREHENSIVE METABOLIC PANEL - Abnormal; Notable for the following components:      Result Value   Glucose, Bld 102 (*)    All other components within normal limits  ACETAMINOPHEN LEVEL - Abnormal; Notable for the following components:   Acetaminophen (Tylenol), Serum <10 (*)    All other components within normal limits  CBC - Abnormal; Notable for the following components:   WBC 11.1 (*)    Platelets 444 (*)    All other components within normal limits  T4, FREE - Abnormal; Notable for the following components:   Free T4 0.73 (*)    All other components within normal limits  URINALYSIS, COMPLETE (UACMP) WITH MICROSCOPIC - Abnormal; Notable for the following components:   Color, Urine YELLOW (*)    APPearance CLOUDY (*)    Ketones, ur 80 (*)    Protein, ur 100 (*)    Leukocytes, UA LARGE (*)    WBC, UA >50 (*)    Bacteria, UA RARE (*)    All other components within normal limits  ETHANOL  SALICYLATE LEVEL  TSH  URINE DRUG SCREEN, QUALITATIVE ([facility] ONLY)  POC URINE PREG, ED  POCT PREGNANCY, URINE   ____________________________________________  EKG  ED ECG REPORT I, SUNG,JADE J, the attending physician, personally viewed and interpreted this ECG.   Date: 03/04/2018  EKG Time: 0036  Rate: 74  Rhythm: normal EKG, normal sinus rhythm  Axis: Normal  Intervals:none  ST&T Change:  Nonspecific  ____________________________________________  RADIOLOGY  ED MD interpretation: No ICH, no acute cardiopulmonary process  Official radiology report(s): Dg Chest 2 View  Result Date: 03/04/2018 CLINICAL DATA:  47 y/o F; manic episode with unexplained rambling/word salad. EXAM: CHEST - 2 VIEW COMPARISON:  None. FINDINGS: The heart size and mediastinal contours are within normal limits. Both lungs are clear. The visualized skeletal structures are unremarkable. IMPRESSION: No active cardiopulmonary disease. Electronically Signed   By: Mitzi Hansen M.D.   On: 03/04/2018 00:45   Ct Head Wo Contrast  Result Date: 03/04/2018 CLINICAL DATA:  47 y/o  F; altered mental status. EXAM: CT HEAD WITHOUT CONTRAST TECHNIQUE: Contiguous axial images were obtained from the base of the skull through the vertex without intravenous contrast. COMPARISON:  None. FINDINGS: Brain: No evidence of acute infarction, hemorrhage, hydrocephalus, extra-axial collection or mass lesion/mass effect. Vascular: No hyperdense vessel or unexpected calcification. Skull: Normal. Negative for fracture or focal lesion. Sinuses/Orbits: No acute finding. Other: None. IMPRESSION: Negative CT of the head. Electronically Signed  By: Mitzi HansenLance  Furusawa-Stratton M.D.   On: 03/04/2018 00:32    ____________________________________________   PROCEDURES   Procedure(s) performed: None  Procedures  Critical Care performed: Yes, see critical care note(s)   CRITICAL CARE Performed by: Irean HongSUNG,JADE J   Total critical care time: 45 minutes  Critical care time was exclusive of separately billable procedures and treating other patients.  Critical care was necessary to treat or prevent imminent or life-threatening deterioration.  Critical care was time spent personally by me on the following activities: development of treatment plan with patient and/or surrogate as well as nursing, discussions with consultants, evaluation  of patient's response to treatment, examination of patient, obtaining history from patient or surrogate, ordering and performing treatments and interventions, ordering and review of laboratory studies, ordering and review of radiographic studies, pulse oximetry and re-evaluation of patient's condition.  ____________________________________________   INITIAL IMPRESSION / ASSESSMENT AND PLAN / ED COURSE  As part of my medical decision making, I reviewed the following data within the electronic MEDICAL RECORD NUMBER History obtained from family, Nursing notes reviewed and incorporated, Labs reviewed, EKG interpreted, Old chart reviewed, Radiograph reviewed, Discussed with admitting physician and Notes from prior ED visits   47 year old female who presents with altered mentation. Differential diagnosis includes, but is not limited to, alcohol, illicit or prescription medications, or other toxic ingestion; intracranial pathology such as stroke or intracerebral hemorrhage; fever or infectious causes including sepsis; hypoxemia and/or hypercarbia; uremia; trauma; endocrine related disorders such as diabetes, hypoglycemia, and thyroid-related diseases; hypertensive encephalopathy; etc.  Laboratory results unremarkable.  While this may be a psychiatric exacerbation of patient's bipolar disorder, her intermittent confusion may also be secondary to UTI, CVA, CAD, etc.  Will discuss with hospitalist to evaluate patient in the emergency department for admission.        ____________________________________________   FINAL CLINICAL IMPRESSION(S) / ED DIAGNOSES  Final diagnoses:  Altered mental status, unspecified altered mental status type  Urinary tract infection without hematuria, site unspecified  Bipolar affective disorder, currently depressed, moderate [facility](HCC)     ED Discharge Orders    None       Note:  This document was prepared using Dragon voice recognition software and may include  unintentional dictation errors.    Irean HongSung, Jade J, MD 03/04/18 [phone]

## 2018-03-04 NOTE — ED Notes (Signed)
AC called regarding bed assignment for the floor.

## 2018-03-04 NOTE — Plan of Care (Signed)
New Admit   Problem: Activity: Goal: Will verbalize the importance of balancing activity with adequate rest periods Outcome: Not Progressing   Problem: Education: Goal: Will be free of psychotic symptoms Outcome: Not Progressing Goal: Knowledge of the prescribed therapeutic regimen will improve Outcome: Not Progressing   Problem: Coping: Goal: Coping ability will improve Outcome: Not Progressing Goal: Will verbalize feelings Outcome: Not Progressing   Problem: Health Behavior/Discharge Planning: Goal: Compliance with prescribed medication regimen will improve Outcome: Not Progressing   Problem: Nutritional: Goal: Ability to achieve adequate nutritional intake will improve Outcome: Not Progressing   Problem: Role Relationship: Goal: Ability to communicate needs accurately will improve Outcome: Not Progressing Goal: Ability to interact with others will improve Outcome: Not Progressing   Problem: Safety: Goal: Ability to redirect hostility and anger into socially appropriate behaviors will improve Outcome: Not Progressing Goal: Ability to remain free from injury will improve Outcome: Not Progressing   Problem: Self-Care: Goal: Ability to participate in self-care as condition permits will improve Outcome: Not Progressing   Problem: Self-Concept: Goal: Will verbalize positive feelings about self Outcome: Not Progressing   Problem: Education: Goal: Knowledge of [place] [facility] Education information/materials will improve Outcome: Not Progressing Goal: Emotional status will improve Outcome: Not Progressing Goal: Mental status will improve Outcome: Not Progressing Goal: Verbalization of understanding the information provided will improve Outcome: Not Progressing   Problem: Activity: Goal: Sleeping patterns will improve Outcome: Not Progressing

## 2018-03-04 NOTE — Tx Team (Signed)
Initial Treatment Plan 03/04/2018 10:32 PM Lindsey FrederickJohanna Sexton ZOX:[id number]    PATIENT STRESSORS: Medication change or noncompliance   PATIENT STRENGTHS: Capable of independent living Financial means Supportive family/friends   PATIENT IDENTIFIED PROBLEMS: Psychosis 03/04/2018  Bipolar DO 03/04/2018                   DISCHARGE CRITERIA:  Ability to meet basic life and health needs Adequate post-discharge living arrangements Improved stabilization in mood, thinking, and/or behavior Motivation to continue treatment in a less acute level of care Verbal commitment to aftercare and medication compliance  PRELIMINARY DISCHARGE PLAN: Attend aftercare/continuing care group Outpatient therapy Return to previous living arrangement  PATIENT/FAMILY INVOLVEMENT: This treatment plan has been presented to and reviewed with the patient, Lindsey Sexton, and/or family member.  The patient and family have been given the opportunity to ask questions and make suggestions.  Lindsey ManilaAlexis E Seneca Gadbois, RN 03/04/2018, 10:32 PM

## 2018-03-04 NOTE — Progress Notes (Addendum)
Patient ID: Lindsey Sexton, female   DOB: 1971-06-10, 47 y.o.   MRN: [id number] 47 year old caucasian female admitted voluntarily for possible mania R/T Bipolar DO. Upon arrival, patient is minimally verbal, aphasic, and unable to answer most questions. Patent nodded yes that she is having AH. When asked what the voices are saying, patient states, "racist." Patient nodded affirmatively that she had not been herself and that her husband was concerned. Denies SI/HI/VH. Patient came from [facility] where she had been being treated for thyroid issues and UTI. Denies pain or discomfort with urination. Patient is moderately confused as she got into another patient's bed after being told multiple times where her room is located. All instructions had to be repeated several times.  Medical Hx + HTN, hypothryroidism, and Bipolar. Patient unable to provide any more information with any reliability. Speech is minimal. Thought processes are impaired. Education was given but patient did not receive benefit from this. Patient is cooperative but unable to fully participate in assessment. Signed forms. Belongings searched, skin check completed, no contraband found. Provided hygiene products and patient changed into scrubs. Q 15 minute checks initiated. Compliant with EKG and HS medications. Patient did attend group after arrival but refused snack. Will continue to monitor throughout the shift. @2300 , patient is dressed and wandering in the hall unable to say why or what she is doing. Patient stood in the hallway near bulletin boards staring. Patient then paced in hallway for several minutes. Paged MD. Order received for Restoril 15 mg PO x 1 now. Upon the medication being available for administration, patient is lying on her bed sleeping. Will continue to monitor.  Patient slept 7 hours. No apparent distress. Will endorse care to oncoming shift.

## 2018-03-04 NOTE — Consult Note (Signed)
Psychiatry: Follow-up information.  I spoke with the patient's mother, Marcha SoldersKaye Henderson, [phone].  She provided much very helpful information.  Years ago apparently the patient had a problem with prescription opiate dependence.  She has had that under control pretty much for a while.  She also does have a past history of psychiatric hospitalization and diagnosis of bipolar disorder.  Mother does not know what medications were used in the past.  Mother also indicates that the patient had been somewhat decompensated to her way of seeing things for probably a couple months recently but this acute psychosis is definitely new.  All of this supports the contention that this is largely a mental health condition.  Spoke with Dr. Imogene Burnhen as well and TTS.  Plan should be for admission to the psychiatric ward downstairs.

## 2018-03-04 NOTE — Progress Notes (Signed)
Report called to nurse on Behavioral Health. Patient and husband aware of discharge behavioral health. Husband given information on visiting hours and questions answered about bringing clothing for her to wear.

## 2018-03-05 DIAGNOSIS — N390 Urinary tract infection, site not specified: Secondary | ICD-10-CM

## 2018-03-05 DIAGNOSIS — F312 Bipolar disorder, current episode manic severe with psychotic features: Secondary | ICD-10-CM

## 2018-03-05 LAB — HEMOGLOBIN A1C
Hgb A1c MFr Bld: 5.6 % (ref 4.8–5.6)
MEAN PLASMA GLUCOSE: 114.02 mg/dL

## 2018-03-05 LAB — LIPID PANEL
Cholesterol: 271 mg/dL — ABNORMAL HIGH (ref 0–200)
HDL: 34 mg/dL — AB (ref >40)
LDL Cholesterol: 209 mg/dL — ABNORMAL HIGH (ref 0–99)
Total CHOL/HDL Ratio: 8 RATIO
Triglycerides: 141 mg/dL (ref <150)
VLDL: 28 mg/dL (ref 0–40)

## 2018-03-05 LAB — TSH: TSH: 7.26 u[IU]/mL — AB (ref 0.350–4.500)

## 2018-03-05 MED ORDER — QUETIAPINE FUMARATE 25 MG PO TABS
50.0000 mg | ORAL_TABLET | Freq: Every evening | ORAL | Status: DC | PRN
  Administered 2018-03-06: 50 mg via ORAL
  Filled 2018-03-05: qty 2

## 2018-03-05 MED ORDER — LEVOTHYROXINE SODIUM 50 MCG PO TABS
50.0000 ug | ORAL_TABLET | Freq: Every day | ORAL | Status: DC
  Administered 2018-03-06 – 2018-03-10 (×5): 50 ug via ORAL
  Filled 2018-03-05 (×5): qty 1

## 2018-03-05 MED ORDER — SULFAMETHOXAZOLE-TRIMETHOPRIM 400-80 MG PO TABS
1.0000 | ORAL_TABLET | Freq: Two times a day (BID) | ORAL | Status: DC
  Filled 2018-03-05: qty 1

## 2018-03-05 MED ORDER — DOCUSATE SODIUM 100 MG PO CAPS: 100.0000 mg | ORAL_CAPSULE | Freq: Two times a day (BID) | ORAL | Status: DC | PRN

## 2018-03-05 MED ORDER — SULFAMETHOXAZOLE-TRIMETHOPRIM 800-160 MG PO TABS
1.0000 | ORAL_TABLET | Freq: Two times a day (BID) | ORAL | Status: AC
  Administered 2018-03-05 – 2018-03-08 (×5): 1 via ORAL
  Filled 2018-03-05 (×8): qty 1

## 2018-03-05 NOTE — [facility]HH Suicide Risk Assessment (Signed)
Lindsey Sexton [facility] Admission Suicide Risk Assessment   Nursing information obtained from:    Demographic factors:  Caucasian Current Mental Status:  NA Loss Factors:  NA Historical Factors:  NA Risk Reduction Factors:  Sense of responsibility to family, Living with another person, especially a relative, Positive social support  Total Time spent with patient, reviewing patient's chart, and discussing patient with treatment team: 75 min    Principal Problem: Bipolar affective disorder, current episode manic with psychotic symptoms ([place]) Diagnosis:   Patient Active Problem List   Diagnosis Date Noted  . UTI (urinary tract infection) [N39.0] 03/05/2018  . Confusion [R41.0] 03/04/2018  . Psychosis ([place]) [F29] 03/04/2018  . Bipolar disorder ([place]) [F31.9] 03/04/2018  . Hypothyroidism [E03.9] 03/04/2018  . Bipolar affective disorder, current episode manic with psychotic symptoms ([place]) [F31.2] 03/04/2018   Subjective Data:  Patient is a 47 year old female with a history of hypertension, hypothyroidism and bipolar disorder.  Patient presents after playing symptoms concerning for mania.  Patient reports that she was first diagnosed with bipolar disorder in the late 1990s.  At that time manic episodes consisted of being "over energized", decreased sleep, "overworking", and overspending.  Patient states she was hospitalized at that time and started on lithium.  Patient reports that after few years of being on psychotropic medications she discontinued them on her own.  Patient states she had been doing well until 2012, when she suffered a depressive episode and was started on Zoloft by her primary care provider for anxiety and depression.  Patient states that she would also take Valium prescribed by her primary care provider at bedtime for sleep.  Patient states that she stopped taking Valium a couple of months ago.  She reports that she was consistently taking Zoloft 150 mg daily.  Recently she started going to a  weight loss clinic called "[facility]."  Patient states that she was prescribed "a natural thyroid pill, phentermine, hCG injections, and some sort of supplement to decrease sweet cravings."  Patient states she had been on this combination of supplements and weight loss medication from the clinic for approximately 10 days.  Patient states she started to notice that she was decompensating and having difficulty with orientation.  She states that her husband was out of town for approximately 3 days.  She stated over that 3-day period, she had difficulty sleeping and stayed up the entire weekend cleaning.  Once her husband returned, he was concerned about the patient's current state so he took her to [facility] hospital, but the provider there reportedly told her that she may have a urinary tract infection so they recommended she go to the emergency department for medical clearance.  Upon ED presentation, patient's urinalysis showed large amount of leukocytes, mucus, and "rare" bacteria.  Urine culture revealed E. coli.  Susceptibilities to follow. However due to patient's continued erratic behavior and speech, the patient was admitted to the behavioral health unit for further evaluation and treatment.  I spoke with patient's husband who reported the same information that Social worker obtained from him as well: Husband [name], pt's husband, at [phone]) .  See excerpt from social worker's Alden Hipp) assessment below: Pt's husbandadded, "Last Tuesday she went in for a yearly check in with her primary care doctor. She also has a weight loss clinic in the same building, and they put her back on an appetite suppressant. I guess it gives her an energy boost too. She was extremely chatty on Wednesday/Thursday. I talked  to her Friday afternoon and she was her normal self-I was out of town for work. When I got home Sunday, she was like she is now. Her train of thought was more  clear then than it is now, though. She told me that she got into a cleaning fit and stayed up for twelve hours straight cleaning the house. In February, she kind of had the same thing happen. She went into work early one morning at 4 or 5AM. She stayed there all day and all night. I called her several times and you could tell she was tired and said she had to finish what she was doing. I woke up the next morning and she still wasn't home. Her boss called me about noon, and said I needed to come get her. She showed some of the same signs that she did the other day. She would see something and fixate on it, like contact information on a sign or something. She'll latch on to certain phrases if she's talking, this time. She's very chatty with me, but isn't talking to doctors. She knows family names-but her son came over the other day and I asked her who that was-she said 59. He races cars and his number is 12. I thought maybe she was just exhausted and got her to sleep good Monday night and some naps on Tuesday. But, Monday it wasn't really better, her mother who has been a nurse said she needed to get in somewhere. She went to her doctor on Tuesday, and after the doctor spoke to her for a little bit, she said she needed labs and recommended we go to the ER. A mental health evaluator came out with the EMTs and they recommended we go to [facility] was a complete waste of time. They thought she was having a medical issue, so we came back home. We were here for about an hour, before we ended up coming over to the ER with you all. When this happened in February, they did a bunch of tests and said that her thyroid was all out of whack. So, they did the test again this time, and I was hoping that was the issue, but apparently it wasn't. When I spoke to her mother, she said about eight years ago she had an episode like this then. That was before I met her. She hasn't seen a psychiatrist in about eight years. I  think she probably thought she didn't have any issues anymore so she didn't need medication. I know she also hasn't been eating pretty much at all since Monday morning. I was only able to get her to eat a few Cheerios on Monday."   Continued Clinical Symptoms:  Alcohol Use Disorder Identification Test Final Score (AUDIT): 0 The "Alcohol Use Disorders Identification Test", Guidelines for Use in Primary Care, Second Edition.  World Pharmacologist [facility]). Score between 0-7:  no or low risk or alcohol related problems. Score between 8-15:  moderate risk of alcohol related problems. Score between 16-19:  high risk of alcohol related problems. Score 20 or above:  warrants further diagnostic evaluation for alcohol dependence and treatment.   CLINICAL FACTORS:   Bipolar Disorder:   mania Previous Psychiatric Diagnoses and Treatments   Musculoskeletal: Strength & Muscle Tone: within normal limits Gait & Station: normal Patient leans: N/A  Psychiatric Specialty Exam: Physical Exam  Nursing note and vitals reviewed.   Psychiatric Specialty Exam: Physical Exam  Nursing note and vitals reviewed. Constitutional: She  appears well-developed and well-nourished.  Eyes: Pupils are equal, round, and reactive to light.  Cardiovascular: Normal rate and regular rhythm.  GI: Soft. Normal Bowel Sounds   Review of Systems  Constitutional: Negative for fever.  HENT: Negative for congestion, sinus pain and sore throat.   Eyes: Negative for blurred vision and pain.  Respiratory: Negative for cough and shortness of breath.   Cardiovascular: Negative for chest pain.  Gastrointestinal: Negative for abdominal pain, constipation, diarrhea, heartburn, nausea and vomiting.  Genitourinary: Negative for dysuria, frequency and urgency.  Musculoskeletal: Negative for back pain, falls, joint pain, myalgias and neck pain.  Skin: Negative for rash.  Neurological: Positive for headaches (mild headache "off  and on" today). Negative for dizziness, tingling, tremors, seizures, loss of consciousness and weakness.  Endo/Heme/Allergies: Bruises/bleeds easily.  Psychiatric/Behavioral: Negative for depression, hallucinations, memory loss, substance abuse and suicidal ideas. The patient does not have insomnia.     Blood pressure 134/89, pulse 81, temperature 98.1 F (36.7 C), temperature source Oral, resp. rate 18, height 5' 6" (1.676 m), weight 84.4 kg (186 lb), SpO2 98 %.Body mass index is 30.02 kg/m.  General Appearance: Hair dyed purple/pink.  Patient is neatly dressed.  Casual dress.  Eye Contact:  Good  Speech:  Normal Rate  Volume:  Normal  Mood:  "I just want to feel better"  Affect:  Constricted  Thought Process:  Linear  Orientation:  Full (Time, Place, and Person)  Thought Content:  Patient denies hallucinations.  Thought processes currently linear.  Suicidal Thoughts:  Patient denies  Homicidal Thoughts: Patient denies  Memory:  Immediate;   Good Recent;   Poor Remote;   Fair  Judgement:  Fair  Insight:  Fair  Psychomotor Activity:  Normal  Concentration:  Concentration: Fair  Recall:  Good  Fund of Knowledge:  Good  Language:  Good  Akathisia:  No  Handed:  Right  AIMS (if indicated):   0  Assets:  Communication Skills Desire for Improvement Financial Resources/Insurance Housing Physical Health Social Support Transportation  ADL's:  Intact  Cognition:  WNL  Sleep:  Number of Hours: 7        COGNITIVE FEATURES THAT CONTRIBUTE TO RISK:  None    SUICIDE RISK:  Risk factors: Prior suicide attempt, history of bipolar disorder, family history of bipolar disorder Protective factors: Patient denies suicidal ideation, patient denies homicidal ideation, patient denies hopelessness, patient is willing to seek treatment, patient denies access to or ownership of guns Acute suicide risk: Low  Minimal: No identifiable suicidal ideation.  Patients presenting with no risk  factors but with morbid ruminations; may be classified as minimal risk based on the severity of the depressive symptoms  Treatment Plan Summary: Daily contact with patient to assess and evaluate symptoms and progress in treatment, Medication management and Plan: 47 year old female with a history of hypothyroidism, hypertension, and bipolar disorder who presents after displaying manic symptoms. Patient recently started a weight loss supplements, including phentermine and hCG injections.  Patient also found to have a UTI on ED presentation.  Patient consents to Seroquel nightly for her current symptomology.  Patient also agrees to a trial of Bactrim for her UTI.  Bipolar 1 disorder, most recent episode mania Seroquel 100 mg nightly; 50 mg nightly as needed for racing thoughts, sleep, agitation, anxiety We will hold Zoloft for now   Hypothyroidism -will start synthroid 50 mcg daily  UTI Bactrim DS twice daily times 3 days Follow-up with antibiotic susceptibility-change antibiotic if needed  Risk (including but not limited to neuromalignant syndrome, dystonic reactions, extraparenchymal symptoms, stiffness, dyskinesia, tardive dyskinesia, anaphylaxis, death, GI distress) and benefits discussed with patient, and she voices understanding and consents to the current medications and treatment plan.  Observation Level/Precautions:  15 minute checks  Laboratory:  CBC, reviewed Chemistry Profile, reviewed HbAIC 5.6 UDS + benzodiazepine, tricyclic Lipid panel-total cholesterol 271, HDL 34, LDL 209, triglycerides 141 UA-UTI-e.coli EKG-QTc 423 TSH 7.260 Labs results discussed with patient  Psychotherapy: Groups  Medications: See above  Consultations:  Possible endocrinology consult for hypothyroidism  Discharge Concerns: Will need follow-up with outpatient psychiatric provider, primary care provider  Estimated LOS: 5-7 days        I certify that inpatient services furnished can  reasonably be expected to improve the patient's condition.   Tennis Ship, MD 03/05/2018, 5:47 PM

## 2018-03-05 NOTE — B[facility] Group Notes (Signed)
  03/05/2018  Time: 1PM  Type of Therapy/Topic:  Group Therapy:  Balance in Life  Participation Level:  Active  Description of Group:   This group will address the concept of balance and how it feels and looks when one is unbalanced. Patients will be encouraged to process areas in their lives that are out of balance and identify reasons for remaining unbalanced. Facilitators will guide patients in utilizing problem-solving interventions to address and correct the stressor making their life unbalanced. Understanding and applying boundaries will be explored and addressed for obtaining and maintaining a balanced life. Patients will be encouraged to explore ways to assertively make their unbalanced needs known to significant others in their lives, using other group members and facilitator for support and feedback.  Therapeutic Goals: 1. Patient will identify two or more emotions or situations they have that consume much of in their lives. 2. Patient will identify signs/triggers that life has become out of balance:  3. Patient will identify two ways to set boundaries in order to achieve balance in their lives:  4. Patient will demonstrate ability to communicate their needs through discussion and/or role plays  Summary of Patient Progress: Pt continues to work towards their tx goals but has not yet reached them. Pt was able to appropriately participate in group discussion, and was able to offer support/validation to other group members. Pt reported one area of her life she would like to devote more attention to is, "my mental and physical health." Pt reported one area of her life she would like to devote less attention to is, "set backs."     Therapeutic Modalities:   Cognitive Behavioral Therapy Solution-Focused Therapy Assertiveness Training  Heidi DachKelsey Traylon Schimming, MSW, LCSW Clinical Social Worker 03/05/2018 2:12 PM

## 2018-03-05 NOTE — BHH Suicide Risk Assessment (Signed)
[place] INPATIENT:  Family/Significant Other Suicide Prevention Education  Suicide Prevention Education:  Education Completed; Lindsey Sexton, pt's husband, at [phone] has been identified by the patient as the family member/significant other with whom the patient will be residing, and identified as the person(s) who will aid the patient in the event of a mental health crisis (suicidal ideations/suicide attempt).  With written consent from the patient, the family member/significant other has been provided the following suicide prevention education, prior to the and/or following the discharge of the patient.  The suicide prevention education provided includes the following:  Suicide risk factors  Suicide prevention and interventions  National Suicide Hotline telephone number  [facility] assessment telephone number  [facility] Emergency Assistance [place] and/or Residential Mobile Crisis Unit telephone number  Request made of family/significant other to:  Remove weapons (e.g., guns, rifles, knives), all items previously/currently identified as safety concern.    Remove drugs/medications (over-the-counter, prescriptions, illicit drugs), all items previously/currently identified as a safety concern.  The family member/significant other verbalizes understanding of the suicide prevention education information provided.  The family member/significant other agrees to remove the items of safety concern listed above.  Pt's husband added, "Last Tuesday she went in for  a yearly check in with her primary care doctor. She also has a weight loss clinic in the same building, and they put her back on an appetite suppressant. I guess it gives her an energy boost too. She was extremely chatty on Wednesday/Thursday. I talked to her Friday afternoon and she was her normal self-I was out of town for work. When I got home Sunday, she was like she is now. Her train of thought was  more clear then than it is now, though. She told me that she got into a cleaning fit and stayed up for twelve hours straight cleaning the house. In February, she kind of had the same thing happen. She went into work early one morning at 4 or 5AM. She stayed there all day and all night. I called her several times and you could tell she was tired and said she had to finish what she was doing. I woke up the next morning and she still wasn't home. Her boss called me about noon, and said I needed to come get her. She showed some of the same signs that she did the other day. She would see something and fixate on it, like contact information on a sign or something. She'll latch on to certain phrases if she's talking, this time. She's very chatty with me, but isn't talking to doctors. She knows family names-but her son came over the other day and I asked her who that was-she said 54. He races cars and his number is 12. I thought maybe she was just exhausted and got her to sleep good Monday night and some naps on Tuesday. But, Monday it wasn't really better, her mother who has been a nurse said she needed to get in somewhere. She went to her doctor on Tuesday, and after the doctor spoke to her for a little bit, she said she needed labs and recommended we go to the ER. A mental health evaluator came out with the EMTs and they recommended we go to [facility] was a complete waste of time. They thought she was having a medical issue, so we came back home. We were here for about an hour, before we ended up coming over to the ER with you  all. When this happened in February, they did a bunch of tests and said that her thyroid was all out of whack. So, they did the test again this time, and I was hoping that was the issue, but apparently it wasn't. When I spoke to her mother, she said about eight years ago she had an episode like this then. That was before I met her. She hasn't seen a psychiatrist in about eight years. I  think she probably thought she didn't have any issues anymore so she didn't need medication. I know she also hasn't been eating pretty much at all since Monday morning. I was only able to get her to eat a few Cheerios on Monday." Pt's husband confirmed pt does not have access to weapons or other means of self harm, and is able to return home upon discharge. CSW will continue to coordinate with pt's husband as needed for further updates/discharge planning.   Alden Hipp, LCSW 03/05/2018, 10:42 AM

## 2018-03-05 NOTE — Plan of Care (Signed)
Patient denies SI/HI/AVH during this assessment. Patient is preoccupied and interaction is minimal. Patient stating "I am resting right now, I don't think I will be out of my room." Patient denies any paranoia at this time. Patient is pleasant and cooperative to care. Patient isolates to room. Patient denies any pain. Patient's safety is maintained on unit.    Problem: Safety: Goal: Ability to remain free from injury will improve Outcome: Progressing   Problem: Education: Goal: Will be free of psychotic symptoms Outcome: Not Progressing Goal: Knowledge of the prescribed therapeutic regimen will improve Outcome: Not Progressing   Problem: Role Relationship: Goal: Ability to interact with others will improve Outcome: Not Progressing   Problem: Education: Goal: Knowledge of General Education information will improve Description Including pain rating scale, medication(s)/side effects and non-pharmacologic comfort measures Outcome: Not Progressing

## 2018-03-05 NOTE — Progress Notes (Signed)
Recreation Therapy Notes  INPATIENT RECREATION THERAPY ASSESSMENT  Patient Details Name: Lindsey Sexton MRN: [id number] DOB: 1971-06-25 Today's Date: 03/05/2018       Information Obtained From: Patient  Able to Participate in Assessment/Interview: Yes  Patient Presentation: Responsive  Reason for Admission (Per Patient): Other (Comments)(Wanting to get better)  Patient Stressors:    Coping Skills:   Talk  Leisure Interests (2+):  Social - Family, Exercise - Walking, Sports - Golf(Bowling)  Frequency of Recreation/Participation: Monthly  Awareness of Community Resources:  Yes  Community Resources:     Current Use: No  If no, Barriers?: Other (Comment)(No time)  Expressed Interest in [employer] CorporationCommunity Resource Information:    [place]  Patient Main Form of Transportation: Car  Patient Strengths:  I do not give up easy  Patient Identified Areas of Improvement:  Askingfor help  Patient Goal for Hospitalization:  To learn to ask for help  Current SI (including self-harm):  No  Current HI:  No  Current AVH: No  Staff Intervention Plan: Group Attendance, Collaborate with Interdisciplinary Treatment Team  Consent to Intern Participation: N/A  Lindsey Sexton 03/05/2018, 1:44 PM

## 2018-03-05 NOTE — H&P (Addendum)
Psychiatric Admission Assessment Adult  Patient Identification: Lindsey Sexton MRN:  [id number] Date of Evaluation:  03/05/2018 Chief Complaint: "I remember getting disoriented and chatty." Principal Diagnosis: Bipolar affective disorder, current episode manic with psychotic symptoms ([place]) Diagnosis:   Patient Active Problem List   Diagnosis Date Noted  . UTI (urinary tract infection) [N39.0] 03/05/2018  . Confusion [R41.0] 03/04/2018  . Psychosis ([place]) [F29] 03/04/2018  . Bipolar disorder ([place]) [F31.9] 03/04/2018  . Hypothyroidism [E03.9] 03/04/2018  . Bipolar affective disorder, current episode manic with psychotic symptoms ([place]) [F31.2] 03/04/2018   Patient is a 47 year old female with a history of hypertension, hypothyroidism and bipolar disorder. Patient presents after playing symptoms concerning for mania. Patient reports that she was first diagnosed with bipolar disorder in the late 1990s. At that time manic episodes consisted of being "over energized", decreased sleep, "overworking", and overspending. Patient states she was hospitalized at that time and started on lithium. Patient reports that after few years of being on psychotropic medications she discontinued them on her own. Patient states she had been doing well until 2012, when she suffered a depressive episode and was started on Zoloft by her primary care provider for anxiety and depression. Patient states that she would also take Valium prescribed by her primary care provider at bedtime for sleep. Patient states that she stopped taking Valium a couple of months ago. She reports that she was consistently taking Zoloft 150 mg daily. Recently she started going to a weight loss clinic called [facility] Patient states that she was prescribed "a natural thyroid pill, phentermine, hCG injections, and some sort of supplement to decrease sweet cravings." Patient states she had been on this combination of supplements and  weight loss medication from the clinic for approximately 10 days. Patient states she started to notice that she was decompensating and having difficulty with orientation. She states that her husband was out of town for approximately 3 days. She stated over that 3-day period, she had difficulty sleeping and stayed up the entire weekend cleaning. Once her husband returned, he was concerned about the patient's current state so he took her to [facility] hospital, but the provider there reportedly told her that she may have a urinary tract infection so they recommended she go to the emergency department for medical clearance. Upon ED presentation, patient's urinalysis showed large amount of leukocytes, mucus, and "rare" bacteria. Urine culture revealed E. coli. Susceptibilities to follow. However due to patient's continued erratic behavior and speech, the patient was admitted to the behavioral health unit for further evaluation and treatment.  I spoke with patient's husband who reported the same information that Social worker obtained from him as well: Husband(Craig Jawad, pt's husband, at [phone]). See excerptfrom social worker's(Kelsey Craig)assessment below: Pt's husbandadded, "Last Tuesday she went in for a yearly check in with her primary care doctor. She also has a weight loss clinic in the same building, and they put her back on an appetite suppressant. I guess it gives her an energy boost too. She was extremely chatty on Wednesday/Thursday. I talked to her Friday afternoon and she was her normal self-I was out of town for work. When I got home Sunday, she was like she is now. Her train of thought was more clear then than it is now, though. She told me that she got into a cleaning fit and stayed up for twelve hours straight cleaning the house. In February, she kind of had the same thing happen. She went into work  early one morning at 4 or 5AM. She stayed there all day  and all night. I called her several times and you could tell she was tired and said she had to finish what she was doing. I woke up the next morning and she still wasn't home. Her boss called me about noon, and said I needed to come get her. She showed some of the same signs that she did the other day. She would see something and fixate on it, like contact information on a sign or something. She'll latch on to certain phrases if she's talking, this time. She's very chatty with me, but isn't talking to doctors. She knows family names-but her son came over the other day and I asked her who that was-she said 68. He races cars and his number is 12. I thought maybe she was just exhausted and got her to sleep good Monday night and some naps on Tuesday. But, Monday it wasn't really better, her mother who has been a nurse said she needed to get in somewhere. She went to her doctor on Tuesday, and after the doctor spoke to her for a little bit, she said she needed labs and recommended we go to the ER. A mental health evaluator came out with the EMTs and they recommended we go to [facility] was a complete waste of time. They thought she was having a medical issue, so we came back home. We were here for about an hour, before we ended up coming over to the ER with you all. When this happened in February, they did a bunch of tests and said that her thyroid was all out of whack. So, they did the test again this time, and I was hoping that was the issue, but apparently it wasn't. When I spoke to her mother, she said about eight years ago she had an episode like this then. That was before I met her. She hasn't seen a psychiatrist in about eight years. I think she probably thought she didn't have any issues anymore so she didn't need medication. I know she also hasn't been eating pretty much at all since Monday morning. I was only able to get her to eat a few Cheerios on Monday."    Associated  Signs/Symptoms: Depression Symptoms:  pt denies feeling depressed, pt states that her appetite has been low. Since being hospitalized patient states she's eaten a little at each meal.   (Hypo) Manic Symptoms:  Distractibility, Elevated Mood, Flight of Ideas, goal directed activity, staying up cleaning for 12 hours, decreased sleep Anxiety Symptoms:  pt denies Psychotic Symptoms:  pt denies AH/VH PTSD Symptoms: none voiced  Eating disorder symptoms: Patient denies history of vomiting, restricting, laxative or diuretic use. Total Time spent with patient, reviewing patient's chart, and discussing patient with treatment team: 75 min   Past Psychiatric History: Patient reports that she was first diagnosed with bipolar disorder in the late 1990s.  She states at that time she experienced a manic episode including overspending, having increased energy, overworking, and decreased sleep.  Patient reports she was hospitalized at [facility] in [place].  Patient states in 2012 she suffered a depressive episode and was hospitalized at [facility].  Patient has been on Zoloft since "late 2018" prescribed by her primary care provider.  She reports that she is up to Zoloft 150 mg daily.  Patient also states that she has taken Valium 10 mg nightly prescribed by her primary care provider as well.  Patient states she has been off Valium for the past 2 months.  Patient states in the late 1990s when she was first diagnosed with bipolar disorder she was started on lithium.  Patient has been off lithium for many years.  Patient currently does not have outpatient psychiatric care.  Is the patient at risk to self? Yes.    Has the patient been a risk to self in the past 6 months? Yes.    Has the patient been a risk to self within the distant past? Yes.    Is the patient a risk to others? No.  Has the patient been a risk to others in the past 6 months? No.  Has the patient been a risk to others within the distant  past? No.   Alcohol Screening: 1. How often do you have a drink containing alcohol?: Never 2. How many drinks containing alcohol do you have on a typical day when you are drinking?: 1 or 2 3. How often do you have six or more drinks on one occasion?: Never AUDIT-C Score: 0 4. How often during the last year have you found that you were not able to stop drinking once you had started?: Never 5. How often during the last year have you failed to do what was normally expected from you becasue of drinking?: Never 6. How often during the last year have you needed a first drink in the morning to get yourself going after a heavy drinking session?: Never 7. How often during the last year have you had a feeling of guilt of remorse after drinking?: Never 8. How often during the last year have you been unable to remember what happened the night before because you had been drinking?: Never 9. Have you or someone else been injured as a result of your drinking?: No 10. Has a relative or friend or a doctor or another health worker been concerned about your drinking or suggested you cut down?: No Alcohol Use Disorder Identification Test Final Score (AUDIT): 0 Intervention/Follow-up: AUDIT Score <7 follow-up not indicated Substance Abuse History in the last 12 months:  No. Consequences of Substance Abuse: NA Previous Psychotropic Medications: Yes  Psychological Evaluations: unknown Past Medical History:  Past Medical History:  Diagnosis Date  . Bipolar disorder ([place])   . Hypothyroidism     Past Surgical History:  Procedure Laterality Date  . TONSILLECTOMY AND ADENOIDECTOMY     Family History: History reviewed. No pertinent family history. Family Psychiatric  History: Patient reports that her maternal great aunt had bipolar disorder.  Patient does not believe anyone in her family has attempted or completed a suicide attempt. Tobacco Screening: Have you used any form of tobacco in the last 30 days?  (Cigarettes, Smokeless Tobacco, Cigars, and/or Pipes): No Social History:  Social History   Substance and Sexual Activity  Alcohol Use Not Currently     Social History   Substance and Sexual Activity  Drug Use Not Currently    Additional Social History: Marital status: Married Number of Years Married: 69 Divorced, when?: Pt is married to her second husband. She got divorced from her first husband in 25.  What types of issues is patient dealing with in the relationship?: None reported.  Additional relationship information: None provided.  Are you sexually active?: Yes What is your sexual orientation?: Heterosexual  Has your sexual activity been affected by drugs, alcohol, medication, or emotional stress?: Pt denies.  Does patient have children?: Yes How many children?: 3 How  is patient's relationship with their children?: Pt reports having three adult sons, (ages 53, 68, and 36). Pt reports having a good relationship with her chidlren, "some not so good moments recently."     Pain Medications: None Prescriptions: See PTA meds Over the Counter: none History of alcohol / drug use?: No history of alcohol / drug abuse      Patient reports that she is from [place] but currently lives in [place] she has been married for the past 7 years.  This is her second marriage.  She states that her first marriage ended in divorce.  She has 3 adult sons, ages 44, 29, 43.  Patient has 2 grand children, 80 and 56-years old.  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  Religion: Christianity.  Hobbies: Watching sports, bowling  Allergies:  No Known Allergies Lab Results:  Results for orders placed or performed during the hospital encounter of 03/04/18 (from the past 48 hour(s))  Hemoglobin A1c     Status: None   Collection Time: 03/05/18  6:08 AM  Result Value Ref Range   Hgb A1c MFr Bld 5.6 4.8 - 5.6 %    Comment:  (NOTE) Pre diabetes:          5.7%-6.4% Diabetes:              >6.4% Glycemic control for   <7.0% adults with diabetes    Mean Plasma Glucose 114.02 mg/dL    Comment: Performed at [facility] [address], [place], [place] [address]  Lipid panel     Status: Abnormal   Collection Time: 03/05/18  6:08 AM  Result Value Ref Range   Cholesterol 271 (H) 0 - 200 mg/dL   Triglycerides 141 <150 mg/dL   HDL 34 (L) >40 mg/dL   Total CHOL/HDL Ratio 8.0 RATIO   VLDL 28 0 - 40 mg/dL   LDL Cholesterol 209 (H) 0 - 99 mg/dL    Comment:        Total Cholesterol/HDL:CHD Risk Coronary Heart Disease Risk Table                     Men   Women  1/2 Average Risk   3.4   3.3  Average Risk       5.0   4.4  2 X Average Risk   9.6   7.1  3 X Average Risk  23.4   11.0        Use the calculated Patient Ratio above and the CHD Risk Table to determine the patient's CHD Risk.        ATP III CLASSIFICATION (LDL):  <100     mg/dL   Optimal  100-129  mg/dL   Near or Above                    Optimal  130-159  mg/dL   Borderline  160-189  mg/dL   High  >190     mg/dL   Very High Performed at [facility], [address] [place], [place] [address]   TSH     Status: Abnormal   Collection Time: 03/05/18  6:08 AM  Result Value Ref Range   TSH 7.260 (H) 0.350 - 4.500 uIU/mL    Comment: Performed by a 3rd Generation assay with a functional sensitivity of <=0.01 uIU/mL. Performed at [facility], [address] [place], [place] [address]     Blood Alcohol level:  Lab Results  Component Value Date   ETH <10 70/62/3762    Metabolic Disorder Labs:  Lab Results  Component Value Date   HGBA1C 5.6 03/05/2018   MPG 114.02 03/05/2018   No results found for: PROLACTIN Lab Results  Component Value Date   CHOL 271 (H) 03/05/2018   TRIG 141 03/05/2018   HDL 34 (L) 03/05/2018   CHOLHDL 8.0 03/05/2018   VLDL 28 03/05/2018   LDLCALC 209 (H) 03/05/2018    Current  Medications: Current Facility-Administered Medications  Medication Dose Route Frequency Provider Last Rate Last Dose  . acetaminophen (TYLENOL) tablet 650 mg  650 mg Oral Q6H PRN Clapacs, John T, MD      . alum & mag hydroxide-simeth (MAALOX/MYLANTA) 200-200-20 MG/5ML suspension 30 mL  30 mL Oral Q4H PRN Clapacs, John T, MD      . amLODipine (NORVASC) tablet 10 mg  10 mg Oral Daily Clapacs, Madie Reno, MD   10 mg at 03/05/18 8315   And  . irbesartan (AVAPRO) tablet 300 mg  300 mg Oral Daily Clapacs, Madie Reno, MD   300 mg at 03/05/18 0842  . docusate sodium (COLACE) capsule 100 mg  100 mg Oral BID PRN Tennis Ship, MD      . magnesium hydroxide (MILK OF MAGNESIA) suspension 30 mL  30 mL Oral Daily PRN Clapacs, John T, MD      . QUEtiapine (SEROQUEL) tablet 100 mg  100 mg Oral QHS Clapacs, John T, MD   100 mg at 03/04/18 2058  . QUEtiapine (SEROQUEL) tablet 50 mg  50 mg Oral QHS PRN Tennis Ship, MD      . sulfamethoxazole-trimethoprim (BACTRIM,SEPTRA) 400-80 MG per tablet 1 tablet  1 tablet Oral Q12H O'Neal, Ayasha Ellingsen, MD      . temazepam (RESTORIL) capsule 15 mg  15 mg Oral Once Clapacs, Madie Reno, MD       PTA Medications: Medications Prior to Admission  Medication Sig Dispense Refill Last Dose  . amLODipine-olmesartan (AZOR) 10-40 MG tablet Take 1 tablet by mouth daily.   03/04/2018 at Unknown time  . cephALEXin (KEFLEX) 500 MG capsule Take 1 capsule (500 mg total) by mouth every 12 (twelve) hours for 4 days.   03/04/2018 at Unknown time  . QUEtiapine (SEROQUEL) 100 MG tablet Take 1 tablet (100 mg total) by mouth at bedtime.   03/04/2018 at Unknown time    Musculoskeletal: Strength & Muscle Tone: within normal limits Gait & Station: normal Patient leans: N/A  Psychiatric Specialty Exam: Physical Exam  Nursing note and vitals reviewed. Constitutional: She appears well-developed and well-nourished.  Eyes: Pupils are equal, round, and reactive to light.  Cardiovascular: Normal rate and regular  rhythm.  GI: Soft.  bowel sounds normal  Review of Systems  Constitutional: Negative for fever.  HENT: Negative for congestion, sinus pain and sore throat.   Eyes: Negative for blurred vision and pain.  Respiratory: Negative for cough and shortness of breath.   Cardiovascular: Negative for chest pain.  Gastrointestinal: Negative for abdominal pain, constipation, diarrhea, heartburn, nausea and vomiting.  Genitourinary: Negative for dysuria, frequency and urgency.  Musculoskeletal: Negative for back pain, falls, joint pain, myalgias and neck pain.  Skin: Negative for rash.  Neurological: Positive for headaches (mild headache "off and on" today). Negative for dizziness, tingling, tremors, seizures, loss of consciousness and weakness.  Endo/Heme/Allergies: Bruises/bleeds easily.  Psychiatric/Behavioral: Negative for depression, hallucinations, memory loss, substance  abuse and suicidal ideas. The patient does not have insomnia.     Blood pressure 134/89, pulse 81, temperature 98.1 F (36.7 C), temperature source Oral, resp. rate 18, height 5' 6" (1.676 m), weight 84.4 kg (186 lb), SpO2 98 %.Body mass index is 30.02 kg/m.  General Appearance: Hair dyed purple/pink.  Patient is neatly dressed.  Casual dress.  Eye Contact:  Good  Speech:  Normal Rate  Volume:  Normal  Mood:  "I just want to feel better"  Affect:  Constricted  Thought Process:  Linear  Orientation:  Full (Time, Place, and Person)  Thought Content:  Patient denies hallucinations.  Thought processes currently linear.  Suicidal Thoughts:  Patient denies  Homicidal Thoughts: Patient denies  Memory:  Immediate;   Good Recent;   Poor Remote;   Fair  Judgement:  Fair  Insight:  Fair  Psychomotor Activity:  Normal  Concentration:  Concentration: Fair  Recall:  Good  Fund of Knowledge:  Good  Language:  Good  Akathisia:  No  Handed:  Right  AIMS (if indicated):   0  Assets:  Communication Skills Desire for  Improvement Financial Resources/Insurance Housing Physical Health Social Support Transportation  ADL's:  Intact  Cognition:  WNL  Sleep:  Number of Hours: 7    Treatment Plan Summary: Daily contact with patient to assess and evaluate symptoms and progress in treatment, Medication management and Plan: 47 year old female with a history of hypothyroidism, hypertension, and bipolar disorder who presents after displaying manic symptoms. Patient recently started a weight loss supplements, including phentermine and hCG injections. Patient also found to have a UTI on ED presentation. Patient consents to Seroquel nightly for her current symptomology. Patient also agrees to a trial of Bactrim for her UTI.  Bipolar 1 disorder, most recent episode mania Seroquel 100 mg nightly; 50 mg nightly as needed for racing thoughts, sleep, agitation, anxiety We will hold Zoloft for now   Hypothyroidism -will start synthroid 50 mcg daily  UTI BactrimDStwice daily times3days Follow-up with antibiotic susceptibility-change antibiotic if needed  Risk (including but not limited toneuromalignantsyndrome, dystonic reactions, extraparenchymal symptoms, stiffness, dyskinesia, tardive dyskinesia,anaphylaxis, death, GI distress)and benefits discussed with patient, and she voices understanding and consents to the current medications and treatment plan.  Observation Level/Precautions:15 minute checks  Laboratory:CBC, reviewed Chemistry Profile, reviewed HbAIC 5.6 UDS + benzodiazepine, tricyclic Lipid panel-total cholesterol 271, HDL 34, LDL 209, triglycerides 141 UA-UTI-e.coli EKG-QTc 423 TSH 7.260 Labs results discussed with patient  Psychotherapy:Groups  Medications:See above  Consultations:Possibleendocrinologyconsult for hypothyroidism  Discharge Concerns:Will need follow-up with outpatient psychiatric provider, primary care provider  Estimated LOS:5-7 days          Physician Treatment Plan for Primary Diagnosis: Bipolar affective disorder, current episode manic with psychotic symptoms ([place]) Long Term Goal(s): Improvement in symptoms so as ready for discharge  Short Term Goals: Ability to identify changes in lifestyle to reduce recurrence of condition will improve, Ability to identify and develop effective coping behaviors will improve and Compliance with prescribed medications will improve  Physician Treatment Plan for Secondary Diagnosis: Principal Problem:   Bipolar affective disorder, current episode manic with psychotic symptoms ([place]) Active Problems:   UTI (urinary tract infection)  Long Term Goal(s): Improvement in symptoms so as ready for discharge  Short Term Goals: Ability to verbalize feelings will improve and Ability to identify and develop effective coping behaviors will improve  I certify that inpatient services furnished can reasonably be expected to improve the patient's condition.    Fredirick Maudlin  Audie Box, MD 7/18/20195:04 PM

## 2018-03-05 NOTE — [facility] Group Notes (Signed)
LCSW Group Therapy Note 03/05/2018 9:00 AM  Type of Therapy and Topic:  Group Therapy:  Setting Goals  Participation Level:  Did Not Attend  Description of Group: In this process group, patients discussed using strengths to work toward goals and address challenges.  Patients identified two positive things about themselves and one goal they were working on.  Patients were given the opportunity to share openly and support each other's plan for self-empowerment.  The group discussed the value of gratitude and were encouraged to have a daily reflection of positive characteristics or circumstances.  Patients were encouraged to identify a plan to utilize their strengths to work on current challenges and goals.  Therapeutic Goals 1. Patient will verbalize personal strengths/positive qualities and relate how these can assist with achieving desired personal goals 2. Patients will verbalize affirmation of peers plans for personal change and goal setting 3. Patients will explore the value of gratitude and positive focus as related to successful achievement of goals 4. Patients will verbalize a plan for regular reinforcement of personal positive qualities and circumstances.  Summary of Patient Progress: Lindsey Sexton was invited to today's group, but chose not to attend.      Therapeutic Modalities Cognitive Behavioral Therapy Motivational Interviewing    Alease FrameSonya S Xia Stohr, [place]CS[place] 03/05/2018 11:31 AM

## 2018-03-05 NOTE — Progress Notes (Signed)
Recreation Therapy Notes  Date: 03/05/2018  Time: 3:00pm  Location: Craft room  Behavioral response: N/A  Group Type: Craft  Participation level: N/A  Communication: Patient did not attend group.  Comments: N/A  Lyanna Blystone LRT/CTRS        Criss Bartles 03/05/2018 4:15 PM

## 2018-03-05 NOTE — Plan of Care (Addendum)
Patient found in common area upon my arrival. Patient is visible but not social this evening. Patient remains bizarre, disorganized, and disoriented. As an example, patient's sister called and when staff attempted to get the patient to come to the phone, she stood up and walked to the far corner of the day room. Patient acted like staff was not talking to her. Reports "I am trying to do better." Reports attempting to eat more and being unable to do so. Attends group and is cooperative with following direction if able to process what is said. Memory impairment evident. Denies SI/HI. Unable to state whether she is still hearing the "racist voices" she reported yesterday. Compliant with HS medications including Bactrim. Denies UTI S/Sx. Q 15 minute checks maintained. Will continue to monitor throughout the shift. @0050 , patient awake and complains of HA 2/10. Given Tylenol and PRN Quetiapine. Will monitor for efficacy. Patient slept 5 hours. Restless. Will endorse care to oncoming shift.  Problem: Activity: Goal: Will verbalize the importance of balancing activity with adequate rest periods Outcome: Progressing   Problem: Education: Goal: Knowledge of the prescribed therapeutic regimen will improve Outcome: Progressing   Problem: Coping: Goal: Coping ability will improve Outcome: Progressing Goal: Will verbalize feelings Outcome: Progressing   Problem: Health Behavior/Discharge Planning: Goal: Compliance with prescribed medication regimen will improve Outcome: Progressing   Problem: Role Relationship: Goal: Ability to communicate needs accurately will improve Outcome: Progressing Goal: Ability to interact with others will improve Outcome: Progressing   Problem: Education: Goal: Will be free of psychotic symptoms Outcome: Not Progressing   Problem: Nutritional: Goal: Ability to achieve adequate nutritional intake will improve Outcome: Not Progressing

## 2018-03-05 NOTE — Progress Notes (Addendum)
Recreation Therapy Notes  Date: 03/05/2018  Time: 9:30 am  Location: Craft [facility]  Behavioral response: Appropriate    Intervention Topic: Problem Solving  Discussion/Intervention:  Group content on today was focused on problem solving. The group described what problem solving is. Patients expressed how problems affect them and how they deal with problems. Individuals identified healthy ways to deal with problems. Patients explained what normally happens to them when they do not deal with problems. The group expressed reoccurring problems for them. The group participated in the intervention "Word Scramble" with their peers and experienced dealing with problems alone versus asking for help with a problem.   Clinical Observations/Feedback:  Patient came to group and stated she tries not to shut herself off from others when she has a problem. Individual explained that problems can turn into major stress if the go unresolved. Individual was social with peers and staff while participating in the intervention. Birdena Kingma LRT/CTRS         Braylin Xu 03/05/2018 12:58 PM

## 2018-03-05 NOTE — [facility] Counselor (Signed)
Adult Comprehensive Assessment  Patient ID: Lindsey Sexton, female   DOB: 02/10/1971, 47 y.o.   MRN: [id number]  Information Source: Information source: Patient  Current Stressors:  Patient states their primary concerns and needs for treatment are:: "To be engaged and be active taking care of myself."  Patient states their goals for this hospitilization and ongoing recovery are:: "I need to have a normal life and be happy again."  Educational / Learning stressors: None reported Employment / Job issues: Pt reports, "I've been working too much."  Family Relationships: No issues reported Surveyor, quantity / Lack of resources (include bankruptcy): No issues reported Housing / Lack of housing: No issues reported Physical health (include injuries & life threatening diseases): No issues reported Social relationships: No issues reported Substance abuse: Pt denies.  Bereavement / Loss: None reported.   Living/Environment/Situation:  Living Arrangements: Spouse/significant other Living conditions (as described by patient or guardian): "great." Who else lives in the home?: husband How long has patient lived in current situation?: since 2012 What is atmosphere in current home: Comfortable, Paramedic, Supportive  Family History:  Marital status: Married Number of Years Married: 7 Divorced, when?: Pt is married to her second husband. She got divorced from her first husband in 77.  What types of issues is patient dealing with in the relationship?: None reported.  Additional relationship information: None provided.  Are you sexually active?: Yes What is your sexual orientation?: Heterosexual  Has your sexual activity been affected by drugs, alcohol, medication, or emotional stress?: Pt denies.  Does patient have children?: Yes How many children?: 3 How is patient's relationship with their children?: Pt reports having three adult sons, (ages 63, 58, and 8). Pt reports having a good relationship with her  chidlren, "some not so good moments recently."   Childhood History:  By whom was/is the patient raised?: Both parents Additional childhood history information: "I was raised by both of them until they got divorced when I was 10."  Description of patient's relationship with caregiver when they were a child: "Great with my mom, and okay with my dad."  Patient's description of current relationship with people who raised him/her: Pt reports, "My mom and I are still very close and I've been estranged from my father for many, many years."  How were you disciplined when you got in trouble as a child/adolescent?: "Normal punishment."  Does patient have siblings?: Yes Number of Siblings: 1 Description of patient's current relationship with siblings: Pt reports having one younger sister. Pt reports having a good relationship with her sister currently but stated, "it's been off and on over the years."  Did patient suffer any verbal/emotional/physical/sexual abuse as a child?: No Did patient suffer from severe childhood neglect?: No Has patient ever been sexually abused/assaulted/raped as an adolescent or adult?: No Was the patient ever a victim of a crime or a disaster?: No Witnessed domestic violence?: No Has patient been effected by domestic violence as an adult?: No  Education:  Highest grade of school patient has completed: 12th grade Currently a student?: No Learning disability?: No  Employment/Work Situation:   Employment situation: Employed 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 How long has patient been employed?: 3 months Patient's job has been impacted by current illness: No What is the longest time patient has a held a job?: 3 years Where was the patient employed at that time?: "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  If attempted suicide, did drugs/alcohol play a role in this?: No Alcohol/Substance Abuse Treatment Hx: Denies past history Has alcohol/substance abuse ever caused legal problems?: No  Social Support System:   Patient's Community Support System: Fair Describe Community Support System: Pt reports her husband, sister, and mother are supportive.  Type of faith/religion: [employer]ri[employer] KnucklesChristian How does patient's faith help to cope with current illness?: Prayer  Leisure/Recreation:   Leisure and Hobbies: "I don't really do anything anymore. I used to enjoy sports and traveling."   Strengths/Needs:   What is the patient's perception of their strengths?: "I'm patient and forgiving."  Patient states they can use these personal strengths during their treatment to contribute to their recovery: "I'm not sure."  Patient states these barriers may affect/interfere with their treatment: "Shutting myself off and managing myself."  Patient states these barriers may affect their return to the community: "I'm not sure."  Other important information patient would like considered in planning for their treatment: N/A  Discharge Plan:   Currently receiving community mental health services: No Patient states concerns and preferences for aftercare planning are: Pt is in agreement with attending outpatient tx upon discharge.  Patient states they will know when they are safe and ready for discharge when: "I'm engaging and active."  Does patient have access to transportation?: Yes Does patient have financial barriers related to discharge medications?: No Patient description of barriers related to discharge medications: N/A Will patient be returning to  same living situation after discharge?: Yes  Summary/Recommendations:   Summary and Recommendations (to be completed by the evaluator): Pt is a 47 year old female who presents to [facility] on a voluntary commitment. Pt reports, "I got jittery and talkative. I had a mental break. I was working too hard, not sleeping, and taking medications that countered each other." Pt denies SI, HI, or AVH. Pt reports she has not slept more than "a handful of hours," in the last week. Pt reports her weight loss doctor put her on a new medication, which she attributes to her mental status change. Pt lives with her husband and is able to return upon discharge. Pt denies alcohol or substance use. Pt has two previous inpatient admissions (one in 2012 and one in the late 1990's). Pt does not currently have an outpatient tx provider, but is in agreement with attending outpatient tx upon discharge. Pt was calm and cooperative with CSW during assessment. Pt presented with a flat affect. Current recommendations for this patient include: crisis stabilization, therapeutic milieu, encouragement to attend and participate in group therapy, and the development of a comprehensive mental wellness plan.   Heidi DachKelsey Jenaveve Fenstermaker, LCSW. 03/05/2018

## 2018-03-06 DIAGNOSIS — F312 Bipolar disorder, current episode manic severe with psychotic features: Secondary | ICD-10-CM

## 2018-03-06 DIAGNOSIS — N390 Urinary tract infection, site not specified: Secondary | ICD-10-CM

## 2018-03-06 LAB — URINE CULTURE: Culture: 70000 — AB

## 2018-03-06 LAB — HIV ANTIBODY (ROUTINE TESTING W REFLEX): HIV SCREEN 4TH GENERATION: NONREACTIVE

## 2018-03-06 MED ORDER — QUETIAPINE FUMARATE 100 MG PO TABS
150.0000 mg | ORAL_TABLET | Freq: Every day | ORAL | Status: DC
  Administered 2018-03-06 – 2018-03-09 (×3): 150 mg via ORAL
  Filled 2018-03-06 (×3): qty 2

## 2018-03-06 NOTE — Progress Notes (Signed)
Recreation Therapy Notes   Date: 03/06/2018  Time: 9:30 am  Location: Craft Room  Behavioral response: Appropriate    Intervention Topic: Leisure  Discussion/Intervention:   Group content today was focused on leisure. The group defined what leisure is and some positive leisure activities they participate in. Individuals identified the difference between good and bad leisure. Participants expressed how they feel after participating in the leisure of their choice. The group discussed how they go about picking a leisure activity and if others are involved in their leisure activities. The patient stated how many leisure activities they too choose from and reasons why it is important to have leisure time. Individuals participated in the intervention "Exploration of Leisure" where they had a chance to identify new leisure activities as well as benefits of leisure.  Clinical Observations/Feedback:  Patient came to group and defined leisure as something you do for enjoyment. She stated she likes to watch sports during leisure time. Individual was pulled from group by doctor but later returned. Patient was social with peers and staff while participating in group.  Shia Eber LRT/CTRS         Javell Blackburn 03/06/2018 12:14 PM

## 2018-03-06 NOTE — [facility] Group Notes (Signed)
[facility] Group Notes:  (Nursing/MHT/Case Management/Adjunct)  Date:  03/06/2018  Time:  2:53 PM  Type of Therapy:  Psychoeducational Skills  Participation Level:  Active  Participation Quality:  Appropriate, Attentive and Sharing  Affect:  Appropriate  Cognitive:  Alert and Appropriate  Insight:  Appropriate  Engagement in Group:  Engaged  Modes of Intervention:  Activity and Education  Summary of Progress/Problems:  Lindsey Sexton Lindsey Sexton 03/06/2018, 2:53 PM

## 2018-03-06 NOTE — Plan of Care (Signed)
Patient stated that she had a better day today.Attended groups.Isolates in her room most of the time.Compliant with medications.Denies SI,HI and AVH.Appetite and energy level good.Support and encouragement given.

## 2018-03-06 NOTE — Plan of Care (Signed)
Isolative in room, poor appetite, pensive and brief in conversations.  Compliant with medications

## 2018-03-06 NOTE — Tx Team (Signed)
Interdisciplinary Treatment and Diagnostic Plan Update  03/06/2018 Time of Session: 1100 Lindsey Sexton MRN: [id number]  Principal Diagnosis: Bipolar affective disorder, current episode manic with psychotic symptoms (HCC)  Secondary Diagnoses: Principal Problem:   Bipolar affective disorder, current episode manic with psychotic symptoms (HCC) Active Problems:   UTI (urinary tract infection)   Current Medications:  Current Facility-Administered Medications  Medication Dose Route Frequency Provider Last Rate Last Dose  . acetaminophen (TYLENOL) tablet 650 mg  650 mg Oral Q6H PRN Clapacs, Jackquline DenmarkJohn T, MD   650 mg at 03/06/18 0049  . alum & mag hydroxide-simeth (MAALOX/MYLANTA) 200-200-20 MG/5ML suspension 30 mL  30 mL Oral Q4H PRN Clapacs, John T, MD      . amLODipine (NORVASC) tablet 10 mg  10 mg Oral Daily Clapacs, Jackquline DenmarkJohn T, MD   10 mg at 03/06/18 0836   And  . irbesartan (AVAPRO) tablet 300 mg  300 mg Oral Daily Clapacs, Jackquline DenmarkJohn T, MD   300 mg at 03/06/18 0836  . docusate sodium (COLACE) capsule 100 mg  100 mg Oral BID PRN Hessie Knows'Neal, Sarita, MD      . levothyroxine (SYNTHROID, LEVOTHROID) tablet 50 mcg  50 mcg Oral QAC breakfast Hessie Knows'Neal, Sarita, MD   50 mcg at 03/06/18 0837  . magnesium hydroxide (MILK OF MAGNESIA) suspension 30 mL  30 mL Oral Daily PRN Clapacs, John T, MD      . QUEtiapine (SEROQUEL) tablet 100 mg  100 mg Oral QHS Clapacs, John T, MD   100 mg at 03/05/18 2100  . QUEtiapine (SEROQUEL) tablet 50 mg  50 mg Oral QHS PRN Hessie Knows'Neal, Sarita, MD   50 mg at 03/06/18 0050  . sulfamethoxazole-trimethoprim (BACTRIM DS,SEPTRA DS) 800-160 MG per tablet 1 tablet  1 tablet Oral Q12H Hessie Knows'Neal, Sarita, MD   1 tablet at 03/06/18 0836  . temazepam (RESTORIL) capsule 15 mg  15 mg Oral Once Clapacs, Jackquline DenmarkJohn T, MD       PTA Medications: Medications Prior to Admission  Medication Sig Dispense Refill Last Dose  . amLODipine-olmesartan (AZOR) 10-40 MG tablet Take 1 tablet by mouth daily.   03/04/2018 at Unknown  time  . cephALEXin (KEFLEX) 500 MG capsule Take 1 capsule (500 mg total) by mouth every 12 (twelve) hours for 4 days.   03/04/2018 at Unknown time  . QUEtiapine (SEROQUEL) 100 MG tablet Take 1 tablet (100 mg total) by mouth at bedtime.   03/04/2018 at Unknown time    Patient Stressors: Medication change or noncompliance  Patient Strengths: Capable of independent living Contractorinancial means Supportive family/friends  Treatment Modalities: Medication Management, Group therapy, Case management,  1 to 1 session with clinician, Psychoeducation, Recreational therapy.   Physician Treatment Plan for Primary Diagnosis: Bipolar affective disorder, current episode manic with psychotic symptoms (HCC) Long Term Goal(s): Improvement in symptoms so as ready for discharge Improvement in symptoms so as ready for discharge   Short Term Goals: Ability to identify changes in lifestyle to reduce recurrence of condition will improve Ability to identify and develop effective coping behaviors will improve Compliance with prescribed medications will improve Ability to verbalize feelings will improve Ability to identify and develop effective coping behaviors will improve  Medication Management: Evaluate patient's response, side effects, and tolerance of medication regimen.  Therapeutic Interventions: 1 to 1 sessions, Unit Group sessions and Medication administration.  Evaluation of Outcomes: Progressing  Physician Treatment Plan for Secondary Diagnosis: Principal Problem:   Bipolar affective disorder, current episode manic with psychotic symptoms (HCC) Active Problems:  UTI (urinary tract infection)  Long Term Goal(s): Improvement in symptoms so as ready for discharge Improvement in symptoms so as ready for discharge   Short Term Goals: Ability to identify changes in lifestyle to reduce recurrence of condition will improve Ability to identify and develop effective coping behaviors will improve Compliance  with prescribed medications will improve Ability to verbalize feelings will improve Ability to identify and develop effective coping behaviors will improve     Medication Management: Evaluate patient's response, side effects, and tolerance of medication regimen.  Therapeutic Interventions: 1 to 1 sessions, Unit Group sessions and Medication administration.  Evaluation of Outcomes: Progressing   RN Treatment Plan for Primary Diagnosis: Bipolar affective disorder, current episode manic with psychotic symptoms (HCC) Long Term Goal(s): Knowledge of disease and therapeutic regimen to maintain health will improve  Short Term Goals: Ability to verbalize feelings will improve, Ability to identify and develop effective coping behaviors will improve and Compliance with prescribed medications will improve  Medication Management: RN will administer medications as ordered by provider, will assess and evaluate patient's response and provide education to patient for prescribed medication. RN will report any adverse and/or side effects to prescribing provider.  Therapeutic Interventions: 1 on 1 counseling sessions, Psychoeducation, Medication administration, Evaluate responses to treatment, Monitor vital signs and CBGs as ordered, Perform/monitor CIWA, COWS, AIMS and Fall Risk screenings as ordered, Perform wound care treatments as ordered.  Evaluation of Outcomes: Progressing   LCSW Treatment Plan for Primary Diagnosis: Bipolar affective disorder, current episode manic with psychotic symptoms (HCC) Long Term Goal(s): Safe transition to appropriate next level of care at discharge, Engage patient in therapeutic group addressing interpersonal concerns.  Short Term Goals: Engage patient in aftercare planning with referrals and resources, Increase emotional regulation and Increase skills for wellness and recovery  Therapeutic Interventions: Assess for all discharge needs, 1 to 1 time with Social worker,  Explore available resources and support systems, Assess for adequacy in community support network, Educate family and significant other(s) on suicide prevention, Complete Psychosocial Assessment, Interpersonal group therapy.  Evaluation of Outcomes: Progressing   Progress in Treatment: Attending groups: Yes. Participating in groups: Yes. Taking medication as prescribed: Yes. Toleration medication: Yes. Family/Significant other contact made: Yes, individual(s) contacted:  pt's husband Patient understands diagnosis: Yes. Discussing patient identified problems/goals with staff: Yes. Medical problems stabilized or resolved: Yes. Denies suicidal/homicidal ideation: Yes. Issues/concerns per patient self-inventory: No. Other: None at this time.   New problem(s) identified: No, Describe:  None at this time.   New Short Term/Long Term Goal(s): Pt reports her goal for treatment is, "to get back to my life."    Patient Goals:  Pt reported her goal for treatment is, "I want to learn how to take care of myself."   Discharge Plan or Barriers: Pt will discharge home and will continue tx in the outpatient setting.   Reason for Continuation of Hospitalization: Medication stabilization  Estimated Length of Stay: 3-5 days  Attendees: Patient: Lindsey Sexton 03/06/2018 11:23 AM  Physician: Dr. Flora Lipps, MD 03/06/2018 11:23 AM  Nursing: Leonia Reader, RN 03/06/2018 11:23 AM  RN Care Manager: 03/06/2018 11:23 AM  Social Worker: Heidi Dach, LCSW 03/06/2018 11:23 AM  Recreational Therapist: Garret Reddish, CTRS-LRT 03/06/2018 11:23 AM  Other: Johny Shears, LCSWA 03/06/2018 11:23 AM  Other: Huey Romans, LCSW 03/06/2018 11:23 AM  Other: Damian Leavell, [place]  03/06/2018 11:23 AM    Scribe for Treatment Team: Heidi Dach, LCSW 03/06/2018 11:23 AM

## 2018-03-06 NOTE — Progress Notes (Signed)
Patient has been isolative in room and did not participate in any evening activities. Upon assessment: patient alert and oriented to person and place, but disoriented to reality. Appears to be lost but avoiding details. Patient denying hallucinations and thoughts of self harm. Patient is brief in conversations and denying pain and distress. Did not attend evening activities and medications were taken to her in room. Did not eat a snack. Was encouraged to get out of the room and get involved in unit activities. Emotional support provided. Safety precautions reinforced.

## 2018-03-06 NOTE — Progress Notes (Signed)
[facility]  03/06/2018 8:17 AM [name]  MRN:  [id number] Subjective: Patient reports she had a good visit with her husband yesterday evening.  Patient states that she will continue to comply with our recommendations so "I can get better."  Patient has been compliant with the scheduled dose of 100 mg Seroquel, but had difficulty sleeping last night.  She requested the additional 50 mg PRN dose of Seroquel.  Patient agrees to increase the nightly dose of seroquel for mood stabilization and sleep.   Pt is attending groups.   Report the patient continues to have intermittent disorganization and disorientation. Patient is on oral antibiotics for urinary tract infection.  Patient denies auditory or visual hallucinations, paranoia, suicidal ideation, and homicidal ideation.  Principal Problem: Bipolar affective disorder, current episode manic with psychotic symptoms (HCC) Diagnosis:   Patient Active Problem List   Diagnosis Date Noted  . UTI (urinary tract infection) [N39.0] 03/05/2018  . Confusion [R41.0] 03/04/2018  . Psychosis (HCC) [F29] 03/04/2018  . Bipolar disorder (HCC) [F31.9] 03/04/2018  . Hypothyroidism [E03.9] 03/04/2018  . Bipolar affective disorder, current episode manic with psychotic symptoms (HCC) [F31.2] 03/04/2018   Total Time spent with patient, doing patient's chart, and discussing patient with treatment team: 35 min  Past Psychiatric History: Patient reports that she was first diagnosed with bipolar disorder in the late 1990s.  She states at that time she experienced a manic episode including overspending, having increased energy, overworking, and decreased sleep.  Patient reports she [facility] [place].  Patient states in 2012 she suffered a depressive episode and was [facility].  Patient has been on Zoloft since "late 2018" prescribed by her primary care provider.  She reports that she is up to Zoloft 150 mg  daily.  Patient also states that she has taken Valium 10 mg nightly prescribed by her primary care provider as well.  Patient states she has been off Valium for the past 2 months.  Patient states in the late 1990s when she was first diagnosed with bipolar disorder she was started on lithium.  Patient has been off lithium for many years.  Patient currently does not have outpatient psychiatric care.    Past Medical History:  Past Medical History:  Diagnosis Date  . Bipolar disorder (HCC)   . Hypothyroidism     Past Surgical History:  Procedure Laterality Date  . TONSILLECTOMY AND ADENOIDECTOMY      Family Psychiatric  History: Patient reports that her maternal great aunt had bipolar disorder.  Patient does not believe anyone in her family has attempted or completed a suicide attempt.   Social History:  Social History   Substance and Sexual Activity  Alcohol Use Not Currently     Social History   Substance and Sexual Activity  Drug Use Not Currently    Social History   Socioeconomic History  . Marital status: Married    Spouse name: Not on file  . Number of children: Not on file  . Years of education: Not on file  . Highest education level: Not on file  Occupational History  . Not on file  Social Needs  . Financial resource strain: Not on file  . Food insecurity:    Worry: Not on file    Inability: Not on file  . Transportation needs:    Medical: Not on file    Non-medical: Not on file  Tobacco Use  . Smoking status: Never Smoker  .  Smokeless tobacco: Never Used  Substance and Sexual Activity  . Alcohol use: Not Currently  . Drug use: Not Currently  . Sexual activity: Yes    Birth control/protection: Surgical  Lifestyle  . Physical activity:    Days per week: Not on file    Minutes per session: Not on file  . Stress: Not on file  Relationships  . Social connections:    Talks on phone: Not on file    Gets together: Not on file    Attends religious service:  Not on file    Active member of club or organization: Not on file    Attends meetings of clubs or organizations: Not on file    Relationship status: Not on file  Other Topics Concern  . Not on file  Social History Narrative  . Not on file   Additional Social History:    Pain Medications: None Prescriptions: See PTA meds Over the Counter: none History of alcohol / drug use?: No history of alcohol / drug abuse   Marital status: Married Number of Years Married: 7 Divorced, when?: Pt is married to her second husband. She got divorced from her first husband in 48.  What types of issues is patient dealing with in the relationship?: None reported.  Additional relationship information: None provided.  Are you sexually active?: Yes What is your sexual orientation?: Heterosexual  Has your sexual activity been affected by drugs, alcohol, medication, or emotional stress?: Pt denies.  Does patient have children?: Yes How many children?: 3 How is patient's relationship with their children?: Pt reports having three adult sons, (ages 95, 27, and 64). Pt reports having a good relationship with her chidlren, "some not so good moments recently."     Pain Medications: None Prescriptions: See PTA meds Over the Counter: none History of alcohol / drug use?: No history of alcohol / drug abuse    Patient reports that she is from [place] but currently lives in [place] she has been married for the past 7 years.  This is her second marriage.  She states that her first marriage ended in divorce.  She has 3 adult sons, ages 29, 27, 42.  Patient has 2 grand children, 5 and 61-years old.  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  Religion: Christianity.  Hobbies: Watching sports, bowling    Sleep: Poor  Appetite:  Improving  Current Medications: Current Facility-Administered Medications  Medication Dose Route Frequency  Provider Last Rate Last Dose  . acetaminophen (TYLENOL) tablet 650 mg  650 mg Oral Q6H PRN Clapacs, Jackquline Denmark, MD   650 mg at 03/06/18 0049  . alum & mag hydroxide-simeth (MAALOX/MYLANTA) 200-200-20 MG/5ML suspension 30 mL  30 mL Oral Q4H PRN Clapacs, John T, MD      . amLODipine (NORVASC) tablet 10 mg  10 mg Oral Daily Clapacs, Jackquline Denmark, MD   10 mg at 03/05/18 1610   And  . irbesartan (AVAPRO) tablet 300 mg  300 mg Oral Daily Clapacs, Jackquline Denmark, MD   300 mg at 03/05/18 0842  . docusate sodium (COLACE) capsule 100 mg  100 mg Oral BID PRN Hessie Knows, MD      . levothyroxine (SYNTHROID, LEVOTHROID) tablet 50 mcg  50 mcg Oral QAC breakfast O'Neal, Dasja Brase, MD      . magnesium hydroxide (MILK OF MAGNESIA) suspension 30 mL  30 mL Oral Daily PRN Clapacs, Jackquline Denmark, MD      .  QUEtiapine (SEROQUEL) tablet 100 mg  100 mg Oral QHS Clapacs, John T, MD   100 mg at 03/05/18 2100  . QUEtiapine (SEROQUEL) tablet 50 mg  50 mg Oral QHS PRN Hessie Knows, MD   50 mg at 03/06/18 0050  . sulfamethoxazole-trimethoprim (BACTRIM DS,SEPTRA DS) 800-160 MG per tablet 1 tablet  1 tablet Oral Q12H Hessie Knows, MD   1 tablet at 03/05/18 2038  . temazepam (RESTORIL) capsule 15 mg  15 mg Oral Once Clapacs, Jackquline Denmark, MD        Lab Results:  Results for orders placed or performed during the hospital encounter of 03/04/18 (from the past 48 hour(s))  Hemoglobin A1c     Status: None   Collection Time: 03/05/18  6:08 AM  Result Value Ref Range   Hgb A1c MFr Bld 5.6 4.8 - 5.6 %    Comment: (NOTE) Pre diabetes:          5.7%-6.4% Diabetes:              >6.4% Glycemic control for   <7.0% adults with diabetes    Mean Plasma Glucose 114.02 mg/dL    Comment: Performed at [facility], [address], [place], [place] [address]  Lipid panel     Status: Abnormal   Collection Time: 03/05/18  6:08 AM  Result Value Ref Range   Cholesterol 271 (H) 0 - 200 mg/dL   Triglycerides 981 <191 mg/dL   HDL 34 (L) >47 mg/dL   Total  CHOL/HDL Ratio 8.0 RATIO   VLDL 28 0 - 40 mg/dL   LDL Cholesterol 829 (H) 0 - 99 mg/dL    Comment:        Total Cholesterol/HDL:CHD Risk Coronary Heart Disease Risk Table                     Men   Women  1/2 Average Risk   3.4   3.3  Average Risk       5.0   4.4  2 X Average Risk   9.6   7.1  3 X Average Risk  23.4   11.0        Use the calculated Patient Ratio above and the CHD Risk Table to determine the patient's CHD Risk.        ATP III CLASSIFICATION (LDL):  <100     mg/dL   Optimal  562-130  mg/dL   Near or Above                    Optimal  130-159  mg/dL   Borderline  865-784  mg/dL   High  >696     mg/dL   Very High Performed at [facility], [address] [place], [place] [address]   TSH     Status: Abnormal   Collection Time: 03/05/18  6:08 AM  Result Value Ref Range   TSH 7.260 (H) 0.350 - 4.500 uIU/mL    Comment: Performed by a 3rd Generation assay with a functional sensitivity of <=0.01 uIU/mL. Performed at [facility], [address] [place], [place] [address]   HIV antibody     Status: None   Collection Time: 03/05/18  6:08 AM  Result Value Ref Range   HIV Screen 4th Generation wRfx Non Reactive Non Reactive    Comment: (NOTE) Performed At: [facility] [address] [place], [place] [id number] Jolene Schimke MD [id number]     Blood  Alcohol level:  Lab Results  Component Value Date   ETH <10 03/03/2018    Metabolic Disorder Labs: Lab Results  Component Value Date   HGBA1C 5.6 03/05/2018   MPG 114.02 03/05/2018   No results found for: PROLACTIN Lab Results  Component Value Date   CHOL 271 (H) 03/05/2018   TRIG 141 03/05/2018   HDL 34 (L) 03/05/2018   CHOLHDL 8.0 03/05/2018   VLDL 28 03/05/2018   LDLCALC 209 (H) 03/05/2018    Physical Findings: AIMS:  , ,  ,  ,    CIWA:    COWS:     Musculoskeletal: Strength & Muscle Tone: within normal limits Gait & Station: normal Patient leans: Normal  stance  Psychiatric Specialty Exam: Physical Exam  Nursing note and vitals reviewed.   ROS  Blood pressure 125/82, pulse 77, temperature 98.7 F (37.1 C), temperature source Oral, resp. rate 18, height 5\' 6"  (1.676 m), weight 84.4 kg (186 lb), SpO2 100 %.Body mass index is 30.02 kg/m.  General Appearance: Casual;  Hair dyed purple/pink.  Patient is neatly dressed.  Eye Contact:  Good  Speech:  Normal Rate  Volume:  Normal  Mood:  "I just want to continue to feel better"; concerned  Affect:  Constricted  Thought Process:  Linear  Orientation:  Full (Time, Place, and Person)  Thought Content:  "focused on getting better"; no voiced delusions or hallucinatinos  Suicidal Thoughts:  No  Homicidal Thoughts:  No  Memory:  grossly intact  Judgement:  Fair  Insight:  Fair  Psychomotor Activity:  Decreased  Concentration:  Concentration: Fair  Recall:  Good  Fund of Knowledge:  Good  Language:  Good  Akathisia:  No  Handed:  Right  AIMS (if indicated):   0  Assets:  Communication Skills Desire for Improvement Financial Resources/Insurance Housing Resilience Social Support Transportation Vocational/Educational  ADL's:  Intact  Cognition:  WNL  Sleep:  Number of Hours: 5     Treatment Plan Summary: Daily contact with patient to assess and evaluate symptoms and progress in treatment, Medication management and Plan: 47 year old female with a history of hypothyroidism, hypertension, and bipolar disorder who presents after displaying manic symptoms.Patient recently started a weight loss supplements, including phentermine and hCG injections.Patient also found to have a UTI on ED presentation. Patient consents to Seroquel nightly for her current symptomology. Patient also agrees to a trial of Bactrim for her UTI.  She received 1 x dose of rocephin in ED, but per hospitalist, will need 3 more days of oral antibiotics.    Bipolar 1 disorder, most recent episode mania Increase  Seroquel from 100 to 150 mg nightly; additional 50 mg nightly as needed for racing thoughts, sleep, agitation, anxiety We will hold Zoloft for now  Hypothyroidism -synthroid 50 mcg daily  UTI BactrimDStwice daily x3days Follow-up with antibiotic susceptibility-change antibiotic if needed  Risk (including but not limited toneuromalignantsyndrome, dystonic reactions, extraparenchymal symptoms, stiffness, dyskinesia, tardive dyskinesia,anaphylaxis, death, GI distress, weight gain, increase in appetite)and benefits discussed with patient, and she voices understanding and consents to the current medications and treatment plan.  Observation Level/Precautions:15 minute checks  Laboratory:CBC, reviewed Chemistry Profile, reviewed HbAIC5.6 UDS+ benzodiazepine, tricyclic Lipid panel-total cholesterol 271, HDL 34, LDL 209, triglycerides 141 UA-UTI-e.coli EKG-QTc 423 TSH 7.260 Labs results discussed with patient  Psychotherapy:Groups  Medications:See above  Consultations:spoke with internal medicine consultant about UTI treatment and hypothyroidism  Discharge Concerns:Will need follow-up with outpatient psychiatric provider, primary care provider  Estimated LOS:5-7 days  Hessie Knows, MD 03/06/2018, 8:17 AM

## 2018-03-06 NOTE — [facility] Group Notes (Signed)
03/06/2018 1PM  Type of Therapy and Topic:  Group Therapy:  Feelings around Relapse and Recovery  Participation Level:  Active   Description of Group:    Patients in this group will discuss emotions they experience before and after a relapse. They will process how experiencing these feelings, or avoidance of experiencing them, relates to having a relapse. Facilitator will guide patients to explore emotions they have related to recovery. Patients will be encouraged to process which emotions are more powerful. They will be guided to discuss the emotional reaction significant others in their lives may have to patients' relapse or recovery. Patients will be assisted in exploring ways to respond to the emotions of others without this contributing to a relapse.  Therapeutic Goals: 1. Patient will identify two or more emotions that lead to a relapse for them 2. Patient will identify two emotions that result when they relapse 3. Patient will identify two emotions related to recovery 4. Patient will demonstrate ability to communicate their needs through discussion and/or role plays   Summary of Patient Progress: Actively and appropriately engaged in the group. Patient was able to provide support and validation to other group members.Patient practiced active listening when interacting with the facilitator and other group members. Pt. Reports a coping skill that she can use while working towards recovery is "cooking". Patient is still in the process of obtaining treatment goals.      Therapeutic Modalities:   Cognitive Behavioral Therapy Solution-Focused Therapy Assertiveness Training Relapse Prevention Therapy   Johny ShearsCassandra  Hadassa Cermak, LCSW 03/06/2018 2:28 PM

## 2018-03-07 DIAGNOSIS — F312 Bipolar disorder, current episode manic severe with psychotic features: Principal | ICD-10-CM

## 2018-03-07 NOTE — Plan of Care (Signed)
Patient is alert and oriented to self, place and time. Present in the milieu this morning with a bright affect. Patient observed smiling and engaging in conversation with peer sitting at the breakfast table. Compliant with her medications. Reports that she slept good last night without the use of an sleep aid. Appetite good with normal energy level. Denies any depression, anxiety and hopelessness. Goal for today is to just stay positive by going to groups and focus on her discharge plan. Milieu remains safe with q 15 minute safety checks. Will continue to monitor.

## 2018-03-07 NOTE — Progress Notes (Signed)
[facility] MD Progress Note  03/07/2018 1:57 PM Bayli Quesinberry  MRN:  [id number] Subjective: Follow-up for this patient with bipolar disorder recent mania.  Patient is remarkably improved from the last time I saw her.  She is able to hold a lucid conversation.  Denies having any hallucinations.  Has reasonably good insight.  Tolerating medicine without any difficulty.  Still has a little trouble with hygiene and still appears slightly confused and slowed down but no longer appears to be unsafe. Principal Problem: Bipolar affective disorder, current episode manic with psychotic symptoms (HCC) Diagnosis:   Patient Active Problem List   Diagnosis Date Noted  . UTI (urinary tract infection) [N39.0] 03/05/2018  . Confusion [R41.0] 03/04/2018  . Psychosis (HCC) [F29] 03/04/2018  . Bipolar disorder (HCC) [F31.9] 03/04/2018  . Hypothyroidism [E03.9] 03/04/2018  . Bipolar affective disorder, current episode manic with psychotic symptoms (HCC) [F31.2] 03/04/2018   Total Time spent with patient: 20 minutes  Past Psychiatric History: Patient has a past history of probable bipolar disorder although with last episode years ago.  Past Medical History:  Past Medical History:  Diagnosis Date  . Bipolar disorder (HCC)   . Hypothyroidism     Past Surgical History:  Procedure Laterality Date  . TONSILLECTOMY AND ADENOIDECTOMY     Family History: History reviewed. No pertinent family history. Family Psychiatric  History: None Social History:  Social History   Substance and Sexual Activity  Alcohol Use Not Currently     Social History   Substance and Sexual Activity  Drug Use Not Currently    Social History   Socioeconomic History  . Marital status: Married    Spouse name: Not on file  . Number of children: Not on file  . Years of education: Not on file  . Highest education level: Not on file  Occupational History  . Not on file  Social Needs  . Financial resource strain: Not on file  . Food  insecurity:    Worry: Not on file    Inability: Not on file  . Transportation needs:    Medical: Not on file    Non-medical: Not on file  Tobacco Use  . Smoking status: Never Smoker  . Smokeless tobacco: Never Used  Substance and Sexual Activity  . Alcohol use: Not Currently  . Drug use: Not Currently  . Sexual activity: Yes    Birth control/protection: Surgical  Lifestyle  . Physical activity:    Days per week: Not on file    Minutes per session: Not on file  . Stress: Not on file  Relationships  . Social connections:    Talks on phone: Not on file    Gets together: Not on file    Attends religious service: Not on file    Active member of club or organization: Not on file    Attends meetings of clubs or organizations: Not on file    Relationship status: Not on file  Other Topics Concern  . Not on file  Social History Narrative  . Not on file   Additional Social History:    Pain Medications: None Prescriptions: See PTA meds Over the Counter: none History of alcohol / drug use?: No history of alcohol / drug abuse                    Sleep: Good  Appetite:  Fair  Current Medications: Current Facility-Administered Medications  Medication Dose Route Frequency Provider Last Rate Last Dose  . acetaminophen (  TYLENOL) tablet 650 mg  650 mg Oral Q6H PRN Aastha Dayley, Jackquline DenmarkJohn T, MD   650 mg at 03/06/18 0049  . alum & mag hydroxide-simeth (MAALOX/MYLANTA) 200-200-20 MG/5ML suspension 30 mL  30 mL Oral Q4H PRN Kyeisha Janowicz T, MD      . amLODipine (NORVASC) tablet 10 mg  10 mg Oral Daily Jace Dowe, Jackquline DenmarkJohn T, MD   10 mg at 03/07/18 0743   And  . irbesartan (AVAPRO) tablet 300 mg  300 mg Oral Daily Kayani Rapaport, Jackquline DenmarkJohn T, MD   300 mg at 03/07/18 0743  . docusate sodium (COLACE) capsule 100 mg  100 mg Oral BID PRN Hessie Knows'Neal, Sarita, MD      . levothyroxine (SYNTHROID, LEVOTHROID) tablet 50 mcg  50 mcg Oral QAC breakfast Hessie Knows'Neal, Sarita, MD   50 mcg at 03/07/18 0743  . magnesium hydroxide (MILK  OF MAGNESIA) suspension 30 mL  30 mL Oral Daily PRN Lorri Fukuhara T, MD      . QUEtiapine (SEROQUEL) tablet 150 mg  150 mg Oral QHS Hessie Knows'Neal, Sarita, MD   150 mg at 03/06/18 2120  . QUEtiapine (SEROQUEL) tablet 50 mg  50 mg Oral QHS PRN Hessie Knows'Neal, Sarita, MD   50 mg at 03/06/18 0050  . sulfamethoxazole-trimethoprim (BACTRIM DS,SEPTRA DS) 800-160 MG per tablet 1 tablet  1 tablet Oral Q12H Hessie Knows'Neal, Sarita, MD   1 tablet at 03/07/18 0743  . temazepam (RESTORIL) capsule 15 mg  15 mg Oral Once Arlow Spiers, Jackquline DenmarkJohn T, MD        Lab Results: No results found for this or any previous visit (from the past 48 hour(s)).  Blood Alcohol level:  Lab Results  Component Value Date   ETH <10 03/03/2018    Metabolic Disorder Labs: Lab Results  Component Value Date   HGBA1C 5.6 03/05/2018   MPG 114.02 03/05/2018   No results found for: PROLACTIN Lab Results  Component Value Date   CHOL 271 (H) 03/05/2018   TRIG 141 03/05/2018   HDL 34 (L) 03/05/2018   CHOLHDL 8.0 03/05/2018   VLDL 28 03/05/2018   LDLCALC 209 (H) 03/05/2018    Physical Findings: AIMS:  , ,  ,  ,    CIWA:    COWS:     Musculoskeletal: Strength & Muscle Tone: within normal limits Gait & Station: normal Patient leans: N/A  Psychiatric Specialty Exam: Physical Exam  Nursing note and vitals reviewed. Constitutional: She appears well-developed and well-nourished.  HENT:  Head: Normocephalic and atraumatic.  Eyes: Pupils are equal, round, and reactive to light. Conjunctivae are normal.  Neck: Normal range of motion.  Cardiovascular: Regular rhythm and normal heart sounds.  Respiratory: Effort normal. No respiratory distress.  GI: Soft.  Musculoskeletal: Normal range of motion.  Neurological: She is alert.  Skin: Skin is warm and dry.  Psychiatric: Judgment normal. Her affect is blunt. Her speech is delayed. She is slowed. Thought content is not paranoid and not delusional. Cognition and memory are impaired. She expresses no homicidal  and no suicidal ideation.    Review of Systems  Constitutional: Negative.   HENT: Negative.   Eyes: Negative.   Respiratory: Negative.   Cardiovascular: Negative.   Gastrointestinal: Negative.   Musculoskeletal: Negative.   Skin: Negative.   Neurological: Negative.   Psychiatric/Behavioral: Negative.     Blood pressure 108/84, pulse 85, temperature 98.5 F (36.9 C), temperature source Oral, resp. rate 18, height 5\' 6"  (1.676 m), weight 84.4 kg (186 lb), SpO2 100 %.Body mass index is 30.02  kg/m.  General Appearance: Casual  Eye Contact:  Good  Speech:  Slow  Volume:  Decreased  Mood:  Euthymic  Affect:  Congruent  Thought Process:  Goal Directed  Orientation:  Full (Time, Place, and Person)  Thought Content:  Logical  Suicidal Thoughts:  No  Homicidal Thoughts:  No  Memory:  Immediate;   Fair Recent;   Fair Remote;   Fair  Judgement:  Fair  Insight:  Fair  Psychomotor Activity:  Decreased  Concentration:  Concentration: Good  Recall:  Good  Fund of Knowledge:  Good  Language:  Good  Akathisia:  No  Handed:  Right  AIMS (if indicated):     Assets:  Communication Skills Desire for Improvement Housing Physical Health Resilience Social Support  ADL's:  Intact  Cognition:  Impaired,  Mild  Sleep:  Number of Hours: 6     Treatment Plan Summary: Medication management and Plan Patient is slightly sluggish but her affect is much better and her thoughts are much clearer.  Patient is tolerating medicine well.  Very appropriate interaction with others.  Plan is to continue current treatment with a possible consideration of discharge sometime into next week.  Supportive counseling done.  Reviewed case with nursing.  Mordecai Rasmussen, MD 03/07/2018, 1:57 PM

## 2018-03-07 NOTE — [facility] Group Notes (Signed)
LCSW Group Therapy Note  03/07/2018 1:15pm  Type of Therapy and Topic: Group Therapy: Holding on to Grudges   Participation Level: Active   Description of Group:  In this group patients will be asked to explore and define a grudge. Patients will be guided to discuss their thoughts, feelings, and reasons as to why people have grudges. Patients will process the impact grudges have on daily life and identify thoughts and feelings related to holding grudges. Facilitator will challenge patients to identify ways to let go of grudges and the benefits this provides. Patients will be confronted to address why one struggles letting go of grudges. Lastly, patients will identify feelings and thoughts related to what life would look like without grudges. This group will be process-oriented, with patients participating in exploration of their own experiences, giving and receiving support, and processing challenge from other group members.  Therapeutic Goals:  1. Patient will identify specific grudges related to their personal life.  2. Patient will identify feelings, thoughts, and beliefs around grudges.  3. Patient will identify how one releases grudges appropriately.  4. Patient will identify situations where they could have let go of the grudge, but instead chose to hold on.   Summary of Patient Progress: The patient scored her mood at a 9 (10 best). Patients were guided to discuss their thoughts, feelings, and reasons as to why people have grudges. The patient was able to process the impact grudges have on daily life and identified thoughts and feelings related to holding grudges. The patient was challenged to identify ways to let go of grudges and the benefits this provides. Pt. actively and appropriately engaged in the group. Patient was able to provide support and validation to other group members.    Therapeutic Modalities:  Cognitive Behavioral Therapy  Solution Focused Therapy  Motivational  Interviewing  Brief Therapy   Lindsey Sexton  CUEBAS-COLON, LCSW 03/07/2018 10:14 AM

## 2018-03-08 NOTE — Plan of Care (Signed)
Patient is alert and oriented to self, place and time. Present in the milieu this morning with a bright affect. Patient observed smiling and engaging in conversation with peer sitting at the breakfast table. Compliant with her medications. Reports that she slept good last night without the use of an sleep aid. Appetite good with normal energy level. Denies any depression, anxiety and hopelessness. Goal for today is to just stay out of bed by going to groups.  Milieu remains safe with q 15 minute safety checks. Will continue to monitor.

## 2018-03-08 NOTE — Plan of Care (Signed)
Patient was more visible in the milieu but refused HS medications. Pleasant and cooperative.

## 2018-03-08 NOTE — Progress Notes (Signed)
Patient was more visible in the milieu, attended group and was pleasant. Had a snack and was observed talking to peers, appropriately. Patient went to bed and fell asleep. Wanted to take medications later but when called in for medications patient refused saying she was very sleepy. Did not have any other issues. This morning, patient was out of bed for vital signs, calm and cooperative. Safety precautions maintained.

## 2018-03-08 NOTE — [facility] Group Notes (Signed)
[facility] Group Notes:  (Nursing/MHT/Case Management/Adjunct)  Date:  03/08/2018  Time:  10:02 PM  Type of Therapy:  Group Therapy  Participation Level:  Active  Participation Quality:  Appropriate  Affect:  Appropriate  Cognitive:  Appropriate  Insight:  Appropriate  Engagement in Group:  Engaged  Modes of Intervention:  Support  Summary of Progress/Problems:  Patient had to move seats in the very beginning of group. Patient stated that another patient was making her uncomfortable and when she was giving hints for them to leave her alone they were not picking up on them.  Lindsey Sexton 03/08/2018, 10:02 PM

## 2018-03-08 NOTE — [facility] Group Notes (Signed)
LCSW Group Therapy Note 03/08/2018 1:15pm  Type of Therapy and Topic: Group Therapy: Feelings Around Returning Home & Establishing a Supportive Framework and Supporting Oneself When Supports Not Available  Participation Level: Active  Description of Group:  Patients first processed thoughts and feelings about upcoming discharge. These included fears of upcoming changes, lack of change, new living environments, judgements and expectations from others and overall stigma of mental health issues. The group then discussed the definition of a supportive framework, what that looks and feels like, and how do to discern it from an unhealthy non-supportive network. The group identified different types of supports as well as what to do when your family/friends are less than helpful or unavailable  Therapeutic Goals  1. Patient will identify one healthy supportive network that they can use at discharge. 2. Patient will identify one factor of a supportive framework and how to tell it from an unhealthy network. 3. Patient able to identify one coping skill to use when they do not have positive supports from others. 4. Patient will demonstrate ability to communicate their needs through discussion and/or role plays.  Summary of Patient Progress:  Patient stated she feels "good." Pt engaged during group session. As patients processed their anxiety about discharge and described healthy supports patient shared that she is ready to be discharge, she indicated she feels much better. Patient listed her family as her main support system.  Patients identified at least one self-care tool they were willing to use after discharge.   Therapeutic Modalities Cognitive Behavioral Therapy Motivational Interviewing   Lindsey Sexton  CUEBAS-COLON, LCSW 03/08/2018 10:05 AM

## 2018-03-08 NOTE — Progress Notes (Signed)
[facility]  03/08/2018 12:19 PM [name]  MRN:  [id number] Subjective: Follow-up patient recovering from psychotic bipolar episode.  Patient continues to say she is feeling much better.  Denies any hallucinations.  Able to carry on lucid conversations.  Not reporting any delusions.  Patient is a little disheveled and withdrawn but has no physical complaints. Principal Problem: Bipolar affective disorder, current episode manic with psychotic symptoms (HCC) Diagnosis:   Patient Active Problem List   Diagnosis Date Noted  . UTI (urinary tract infection) [N39.0] 03/05/2018  . Confusion [R41.0] 03/04/2018  . Psychosis (HCC) [F29] 03/04/2018  . Bipolar disorder (HCC) [F31.9] 03/04/2018  . Hypothyroidism [E03.9] 03/04/2018  . Bipolar affective disorder, current episode manic with psychotic symptoms (HCC) [F31.2] 03/04/2018   Total Time spent with patient: 20 minutes  Past Psychiatric History: History of distant bipolar episode with recent episode probably related to lack of sleep  Past Medical History:  Past Medical History:  Diagnosis Date  . Bipolar disorder (HCC)   . Hypothyroidism     Past Surgical History:  Procedure Laterality Date  . TONSILLECTOMY AND ADENOIDECTOMY     Family History: History reviewed. No pertinent family history. Family Psychiatric  History: See previous notes Social History:  Social History   Substance and Sexual Activity  Alcohol Use Not Currently     Social History   Substance and Sexual Activity  Drug Use Not Currently    Social History   Socioeconomic History  . Marital status: Married    Spouse name: Not on file  . Number of children: Not on file  . Years of education: Not on file  . Highest education level: Not on file  Occupational History  . Not on file  Social Needs  . Financial resource strain: Not on file  . Food insecurity:    Worry: Not on file    Inability: Not on file  . Transportation needs:    Medical: Not  on file    Non-medical: Not on file  Tobacco Use  . Smoking status: Never Smoker  . Smokeless tobacco: Never Used  Substance and Sexual Activity  . Alcohol use: Not Currently  . Drug use: Not Currently  . Sexual activity: Yes    Birth control/protection: Surgical  Lifestyle  . Physical activity:    Days per week: Not on file    Minutes per session: Not on file  . Stress: Not on file  Relationships  . Social connections:    Talks on phone: Not on file    Gets together: Not on file    Attends religious service: Not on file    Active member of club or organization: Not on file    Attends meetings of clubs or organizations: Not on file    Relationship status: Not on file  Other Topics Concern  . Not on file  Social History Narrative  . Not on file   Additional Social History:    Pain Medications: None Prescriptions: See PTA meds Over the Counter: none History of alcohol / drug use?: No history of alcohol / drug abuse                    Sleep: Fair  Appetite:  Fair  Current Medications: Current Facility-Administered Medications  Medication Dose Route Frequency Provider Last Rate Last Dose  . acetaminophen (TYLENOL) tablet 650 mg  650 mg Oral Q6H PRN Brondon Wann, Jackquline DenmarkJohn T, MD   650 mg at 03/06/18 0049  .  alum & mag hydroxide-simeth (MAALOX/MYLANTA) 200-200-20 MG/5ML suspension 30 mL  30 mL Oral Q4H PRN Patra Gherardi T, MD      . amLODipine (NORVASC) tablet 10 mg  10 mg Oral Daily Karisma Meiser, Jackquline Denmark, MD   10 mg at 03/08/18 1610   And  . irbesartan (AVAPRO) tablet 300 mg  300 mg Oral Daily Leovardo Thoman, Jackquline Denmark, MD   300 mg at 03/08/18 0837  . docusate sodium (COLACE) capsule 100 mg  100 mg Oral BID PRN Hessie Knows, MD      . levothyroxine (SYNTHROID, LEVOTHROID) tablet 50 mcg  50 mcg Oral QAC breakfast Hessie Knows, MD   50 mcg at 03/08/18 0837  . magnesium hydroxide (MILK OF MAGNESIA) suspension 30 mL  30 mL Oral Daily PRN Chriselda Leppert T, MD      . QUEtiapine (SEROQUEL)  tablet 150 mg  150 mg Oral QHS Hessie Knows, MD   150 mg at 03/06/18 2120  . QUEtiapine (SEROQUEL) tablet 50 mg  50 mg Oral QHS PRN Hessie Knows, MD   50 mg at 03/06/18 0050  . sulfamethoxazole-trimethoprim (BACTRIM DS,SEPTRA DS) 800-160 MG per tablet 1 tablet  1 tablet Oral Q12H Hessie Knows, MD   1 tablet at 03/08/18 0837  . temazepam (RESTORIL) capsule 15 mg  15 mg Oral Once Lamount Bankson, Jackquline Denmark, MD        Lab Results: No results found for this or any previous visit (from the past 48 hour(s)).  Blood Alcohol level:  Lab Results  Component Value Date   ETH <10 03/03/2018    Metabolic Disorder Labs: Lab Results  Component Value Date   HGBA1C 5.6 03/05/2018   MPG 114.02 03/05/2018   No results found for: PROLACTIN Lab Results  Component Value Date   CHOL 271 (H) 03/05/2018   TRIG 141 03/05/2018   HDL 34 (L) 03/05/2018   CHOLHDL 8.0 03/05/2018   VLDL 28 03/05/2018   LDLCALC 209 (H) 03/05/2018    Physical Findings: AIMS:  , ,  ,  ,    CIWA:    COWS:     Musculoskeletal: Strength & Muscle Tone: within normal limits Gait & Station: normal Patient leans: N/A  Psychiatric Specialty Exam: Physical Exam  Nursing note and vitals reviewed. Constitutional: She appears well-developed and well-nourished.  HENT:  Head: Normocephalic and atraumatic.  Eyes: Pupils are equal, round, and reactive to light. Conjunctivae are normal.  Neck: Normal range of motion.  Cardiovascular: Regular rhythm and normal heart sounds.  Respiratory: Effort normal. No respiratory distress.  GI: Soft.  Musculoskeletal: Normal range of motion.  Neurological: She is alert.  Skin: Skin is warm and dry.  Psychiatric: She has a normal mood and affect. Her behavior is normal. Judgment and thought content normal.    Review of Systems  Constitutional: Negative.   HENT: Negative.   Eyes: Negative.   Respiratory: Negative.   Cardiovascular: Negative.   Gastrointestinal: Negative.   Musculoskeletal:  Negative.   Skin: Negative.   Neurological: Negative.   Psychiatric/Behavioral: Negative for depression, hallucinations, memory loss, substance abuse and suicidal ideas. The patient is not nervous/anxious and does not have insomnia.     Blood pressure 129/78, pulse 78, temperature 98.2 F (36.8 C), temperature source Oral, resp. rate 16, height 5\' 6"  (1.676 m), weight 84.4 kg (186 lb), SpO2 100 %.Body mass index is 30.02 kg/m.  General Appearance: Casual  Eye Contact:  Fair  Speech:  Slow  Volume:  Decreased  Mood:  Euthymic  Affect:  Constricted  Thought Process:  Goal Directed  Orientation:  Full (Time, Place, and Person)  Thought Content:  Logical  Suicidal Thoughts:  No  Homicidal Thoughts:  No  Memory:  Immediate;   Fair Recent;   Fair Remote;   Fair  Judgement:  Fair  Insight:  Fair  Psychomotor Activity:  Decreased  Concentration:  Concentration: Fair  Recall:  [employer] of Knowledge:  Fair  Language:  Fair  Akathisia:  No  Handed:  Right  AIMS (if indicated):     Assets:  Communication Skills Desire for Improvement Financial Resources/Insurance Housing Social Support  ADL's:  Intact  Cognition:  WNL  Sleep:  Number of Hours: 6     Treatment Plan Summary: Daily contact with patient to assess and evaluate symptoms and progress in treatment, Medication management and Plan Patient stabilizing very well.  Seems to be through by far the worst of the psychotic episode.  No change to medicine.  No new physical complaints.  Encourage patient to continue interacting with staff and talk with her treatment team about discharge planning  Mordecai Rasmussen, MD 03/08/2018, 12:19 PM

## 2018-03-08 NOTE — Plan of Care (Signed)
  D: Pt denies SI/HI/AVH. Pt is pleasant and cooperative. Pt visible in the dayroom this evening . "I feel much better". Pt stated she was steadily improving.   A: Pt was offered support and encouragement. Pt was given scheduled medications. Pt was encourage to attend groups. Q 15 minute checks were done for safety.   R:Pt attends groups and interacts well with peers and staff. Pt is taking medication. Pt has no complaints.Pt receptive to treatment and safety maintained on unit.  Problem: Education: Goal: Will be free of psychotic symptoms Outcome: Progressing   Problem: Coping: Goal: Coping ability will improve Outcome: Progressing   Problem: Coping: Goal: Will verbalize feelings Outcome: Progressing   Problem: Role Relationship: Goal: Ability to interact with others will improve Outcome: Progressing   Problem: Safety: Goal: Ability to remain free from injury will improve Outcome: Progressing

## 2018-03-09 DIAGNOSIS — N390 Urinary tract infection, site not specified: Secondary | ICD-10-CM

## 2018-03-09 DIAGNOSIS — F312 Bipolar disorder, current episode manic severe with psychotic features: Secondary | ICD-10-CM

## 2018-03-09 NOTE — Plan of Care (Signed)
Patients affect is bright,appropriate with staff & peers.Denies SI,HI and AVH.Compliant with medications.Patient stated that she thinks  she is ready for discharge.Appetite and energy level good.Support and encouragement given.

## 2018-03-09 NOTE — [facility] Group Notes (Signed)
[facility] Group Notes:  (Nursing/MHT/Case Management/Adjunct)  Date:  03/09/2018  Time:  11:08 PM  Type of Therapy:  Group Therapy  Participation Level:  Active  Participation Quality:  Appropriate  Affect:  Appropriate  Cognitive:  Appropriate  Insight:  Appropriate  Engagement in Group:  Engaged  Modes of Intervention:  Discussion  Summary of Progress/Problems:  Lindsey Sexton 03/09/2018, 11:08 PM 

## 2018-03-09 NOTE — Progress Notes (Signed)
[facility] MD Progress Note  03/09/2018 5:08 PM Lindsey Sexton  MRN:  [id number] Subjective:  Patient is doing well. She reports having a great weekend.  She states she feels less confused and feels ready for discharge.  She is medication compliant. She is sleeping and eating well.  She states visits with her husband have been going well.  She is completely alert and oriented today. Abstract reasoning is intact.  Attention and concentration are also good.  Patient admits that her UDS was positive for benzodiazepines due likely taking a dose of valium that was prescribed by her PCP. Pt plans to follow up with Lindsey Sexton at [facility] in [place] for her recent UTI, thyroid issues and blood pressure.     Principal Problem: Bipolar affective disorder, current episode manic with psychotic symptoms (HCC) Diagnosis:   Patient Active Problem List   Diagnosis Date Noted  . UTI (urinary tract infection) [N39.0] 03/05/2018  . Confusion [R41.0] 03/04/2018  . Psychosis (HCC) [F29] 03/04/2018  . Bipolar disorder (HCC) [F31.9] 03/04/2018  . Hypothyroidism [E03.9] 03/04/2018  . Bipolar affective disorder, current episode manic with psychotic symptoms (HCC) [F31.2] 03/04/2018   Total Time spent with patient, discussing patient with her treatment plan and reviewing patient's chart: 35 min  Past Psychiatric History:  See H&P  Past Medical History:  Past Medical History:  Diagnosis Date  . Bipolar disorder (HCC)   . Hypothyroidism     Past Surgical History:  Procedure Laterality Date  . TONSILLECTOMY AND ADENOIDECTOMY     Family History: History reviewed. No pertinent family history. Family Psychiatric  History: None Social History:  Social History   Substance and Sexual Activity  Alcohol Use Not Currently     Social History   Substance and Sexual Activity  Drug Use Not Currently    Social History   Socioeconomic History  . Marital status: Married    Spouse name: Not on file   . Number of children: Not on file  . Years of education: Not on file  . Highest education level: Not on file  Occupational History  . Not on file  Social Needs  . Financial resource strain: Not on file  . Food insecurity:    Worry: Not on file    Inability: Not on file  . Transportation needs:    Medical: Not on file    Non-medical: Not on file  Tobacco Use  . Smoking status: Never Smoker  . Smokeless tobacco: Never Used  Substance and Sexual Activity  . Alcohol use: Not Currently  . Drug use: Not Currently  . Sexual activity: Yes    Birth control/protection: Surgical  Lifestyle  . Physical activity:    Days per week: Not on file    Minutes per session: Not on file  . Stress: Not on file  Relationships  . Social connections:    Talks on phone: Not on file    Gets together: Not on file    Attends religious service: Not on file    Active member of club or organization: Not on file    Attends meetings of clubs or organizations: Not on file    Relationship status: Not on file  Other Topics Concern  . Not on file  Social History Narrative  . Not on file   Additional Social History:    Pain Medications: None Prescriptions: See PTA meds Over the Counter: none History of alcohol / drug use?: No history of alcohol / drug abuse  See H&P Sleep: Good  Appetite:  Fair  Current Medications: Current Facility-Administered Medications  Medication Dose Route Frequency Provider Last Rate Last Dose  . acetaminophen (TYLENOL) tablet 650 mg  650 mg Oral Q6H PRN Clapacs, Jackquline DenmarkJohn T, MD   650 mg at 03/06/18 0049  . alum & mag hydroxide-simeth (MAALOX/MYLANTA) 200-200-20 MG/5ML suspension 30 mL  30 mL Oral Q4H PRN Clapacs, John T, MD      . amLODipine (NORVASC) tablet 10 mg  10 mg Oral Daily Clapacs, Jackquline DenmarkJohn T, MD   10 mg at 03/09/18 16100817   And  . irbesartan (AVAPRO) tablet 300 mg  300 mg Oral Daily Clapacs, Jackquline DenmarkJohn T, MD   300 mg at 03/09/18 0817  . docusate sodium (COLACE) capsule 100 mg   100 mg Oral BID PRN Hessie Knows'Neal, Fitzpatrick Alberico, MD      . levothyroxine (SYNTHROID, LEVOTHROID) tablet 50 mcg  50 mcg Oral QAC breakfast Hessie Knows'Neal, Hertha Gergen, MD   50 mcg at 03/09/18 0817  . magnesium hydroxide (MILK OF MAGNESIA) suspension 30 mL  30 mL Oral Daily PRN Clapacs, John T, MD      . QUEtiapine (SEROQUEL) tablet 150 mg  150 mg Oral QHS Hessie Knows'Neal, Lacye Mccarn, MD   150 mg at 03/08/18 2237  . QUEtiapine (SEROQUEL) tablet 50 mg  50 mg Oral QHS PRN Hessie Knows'Neal, Arielis Leonhart, MD   50 mg at 03/06/18 0050  . temazepam (RESTORIL) capsule 15 mg  15 mg Oral Once Clapacs, Jackquline DenmarkJohn T, MD        Lab Results: No results found for this or any previous visit (from the past 48 hour(s)).  Blood Alcohol level:  Lab Results  Component Value Date   ETH <10 03/03/2018    Metabolic Disorder Labs: Lab Results  Component Value Date   HGBA1C 5.6 03/05/2018   MPG 114.02 03/05/2018   No results found for: PROLACTIN Lab Results  Component Value Date   CHOL 271 (H) 03/05/2018   TRIG 141 03/05/2018   HDL 34 (L) 03/05/2018   CHOLHDL 8.0 03/05/2018   VLDL 28 03/05/2018   LDLCALC 209 (H) 03/05/2018    Physical Findings: AIMS:  , ,  ,  ,    CIWA:    COWS:     Musculoskeletal: Strength & Muscle Tone: within normal limits Gait & Station: normal Patient leans: N/A  Psychiatric Specialty Exam: Physical Exam  Nursing note and vitals reviewed. Constitutional: She appears well-developed and well-nourished.  HENT:  Head: Normocephalic and atraumatic.  Eyes: Pupils are equal, round, and reactive to light. Conjunctivae are normal.  Neck: Normal range of motion.  Cardiovascular: Regular rhythm and normal heart sounds.  Respiratory: Effort normal. No respiratory distress.  GI: Soft.  Musculoskeletal: Normal range of motion.  Neurological: She is alert.  Skin: Skin is warm and dry.  Psychiatric: She has a normal mood and affect. Her speech is normal and behavior is normal. Judgment normal. Thought content is not paranoid and not  delusional. Cognition and memory are normal. She expresses no homicidal and no suicidal ideation.    Review of Systems  Constitutional: Negative.   HENT: Negative.   Eyes: Negative.   Respiratory: Negative.   Cardiovascular: Negative.   Gastrointestinal: Negative.   Musculoskeletal: Negative.   Skin: Negative.   Neurological: Negative.   Psychiatric/Behavioral: Negative.     Blood pressure 113/74, pulse 99, temperature 98.6 F (37 C), temperature source Oral, resp. rate 16, height 5\' 6"  (1.676 m), weight 84.4 kg (186 lb), SpO2 100 %.Body  mass index is 30.02 kg/m.  General Appearance: Casual, purple hair  Eye Contact:  Good  Speech:  Slow  Volume:  Normal  Mood:  Euthymic  Affect:  Congruent  Thought Process:  Goal Directed  Orientation:  Full (Time, Place, and Person)  Thought Content:  Logical  Suicidal Thoughts:  No  Homicidal Thoughts:  No  Memory:  Immediate;   Fair Recent;   Fair Remote;   Fair  Judgement:  Fair  Insight:  Fair  Psychomotor Activity:  Decreased  Concentration:  Concentration: Good  Recall:  Good  Fund of Knowledge:  Good  Language:  Good  Akathisia:  No  Handed:  Right  AIMS (if indicated):     Assets:  Communication Skills Desire for Improvement Housing Physical Health Resilience Social Support  ADL's:  Intact  Cognition:  Impaired,  Mild  Sleep:  Number of Hours: 7     Treatment Plan Summary: Patient is progressing well.  Will plan to discharge on 7/23.   Bipolar 1 disorder, most recent episode mania Continue seroquel as ordered  Hypothyroidism -synthroid 50 mcg daily  UTI 1 time dose of IV rocephin and Completed course of bactrim DS (3 day oral course)  Risk (including but not limited toneuromalignantsyndrome, dystonic reactions, extraparenchymal symptoms, stiffness, dyskinesia, tardive dyskinesia,anaphylaxis, death, GI distress, weight gain, increase in appetite)and benefits discussed with patient, and she voices  understanding and consents to the current medications and treatment plan.  Observation Level/Precautions:15 minute checks  Laboratory:CBC, reviewed Chemistry Profile, reviewed HbAIC5.6 UDS+ benzodiazepine, tricyclic Lipid panel-total cholesterol 271, HDL 34, LDL 209, triglycerides 141 UA-UTI-e.coli EKG-QTc 423 TSH 7.260 Labs results discussed with patient  Psychotherapy:Groups  Medications:See above  Consultations:spoke with internal medicine consultant about UTI treatment and hypothyroidism  Discharge Concerns: [facility] vs [facility]; will also need f/u with primary care provider for hypothyroidism, HTN and recent UTI.   Discharge date: 7/23        Hessie Knows, MD 03/09/2018, 5:08 PM

## 2018-03-09 NOTE — Progress Notes (Signed)
Recreation Therapy Notes  Date: 03/09/2018  Time: 9:30 am  Location: Craft Room  Behavioral response: Appropriate    Intervention Topic: Creative Expression  Discussion/Intervention:   Group content on today was focused on creative expressions. The group defined creative expressions and ways they use creative expressions. Individual identified other positive ways creative expressions can be used and why it is important to express yourself. Patients participated in the intervention "expressive painting", where they had a chance to creatively express themselves.  Clinical Observations/Feedback:  Patient came to group and stated she normally does not express her self much because she is shy. She explained that creative expression is important to let others know how you feel about them. Individual was social with peers and staff while participating in group.  Itzel Mckibbin LRT/CTRS           Emmanuelle Hibbitts 03/09/2018 12:12 PM

## 2018-03-10 DIAGNOSIS — N390 Urinary tract infection, site not specified: Secondary | ICD-10-CM

## 2018-03-10 MED ORDER — LEVOTHYROXINE SODIUM 50 MCG PO TABS: 50.0000 ug | ORAL_TABLET | Freq: Every day | ORAL | 0 refills | Status: DC

## 2018-03-10 MED ORDER — QUETIAPINE FUMARATE 100 MG PO TABS: 150.0000 mg | ORAL_TABLET | Freq: Every day | ORAL | 0 refills | Status: DC

## 2018-03-10 NOTE — [facility] Suicide Risk Assessment (Addendum)
[facility] [place] Discharge Suicide Risk Assessment   Principal Problem: Bipolar affective disorder, current episode manic with psychotic symptoms [facility]) Discharge Diagnoses:  Patient Active Problem List   Diagnosis Date Noted  . UTI (urinary tract infection) [N39.0] 03/05/2018  . Confusion [R41.0] 03/04/2018  . Psychosis (HCC) [F29] 03/04/2018  . Bipolar disorder (HCC) [F31.9] 03/04/2018  . Hypothyroidism [E03.9] 03/04/2018  . Bipolar affective disorder, current episode manic with psychotic symptoms (HCC) [F31.2] 03/04/2018    Total Time spent with patient, preparing patient's discharge, reviewing chart, and discussing patient with her treatment team: 40 min  Musculoskeletal: Strength & Muscle Tone: within normal limits Gait & Station: normal Patient leans: N/A  Psychiatric Specialty Exam: Review of Systems  Constitutional: Negative for chills and fever.  HENT: Negative for congestion, ear discharge, ear pain, sinus pain and sore throat.   Eyes: Negative for blurred vision, double vision, pain, discharge and redness.  Respiratory: Negative for cough, shortness of breath and wheezing.   Cardiovascular: Negative for chest pain and leg swelling.  Gastrointestinal: Negative for abdominal pain, constipation, diarrhea, nausea and vomiting.  Genitourinary: Negative for dysuria and urgency.  Musculoskeletal: Negative for back pain, falls, joint pain, myalgias and neck pain.  Skin: Negative for itching and rash.  Neurological: Negative for dizziness, tingling, tremors, seizures, loss of consciousness, weakness and headaches.  Psychiatric/Behavioral: Negative for depression, hallucinations, memory loss, substance abuse and suicidal ideas. The patient is not nervous/anxious and does not have insomnia.     Blood pressure 107/81, pulse 91, temperature 97.9 F (36.6 C), temperature source Oral, resp. rate 18, height 5\' 6"  (1.676 m), weight 84.4 kg (186 lb), SpO2 100 %.Body mass index is 30.02 kg/m.   General Appearance: Well Groomed, casually and neatly dressed; hygiene good.   Eye Contact::  Good  Speech:  Normal Rate409  Volume:  Normal  Mood:  "positive"  Affect:  Full Range  Thought Process:  Coherent and Linear  Orientation:  Full (Time, Place, and Person)  Thought Content:  Logical  Suicidal Thoughts:  No; patient denies  Homicidal Thoughts:  No: patient denies  Memory:  intact  Judgement:  Good  Insight:  Good  Psychomotor Activity:  Normal  Concentration:  Good  Recall:  Good  Fund of Knowledge:Good  Language: Good  Akathisia:  No  Handed:  Right  AIMS (if indicated):   0  Assets:  Communication Skills Desire for Improvement Financial Resources/Insurance Housing Physical Health Resilience Social Support Transportation Vocational/Educational  Sleep:  Number of Hours: 7  Cognition: WNL  ADL's:  Intact   Mental Status Per Nursing Assessment::   On Admission:  NA  Demographic Factors:  Caucasian, female, married, has adult children, employed  Loss Factors: NA  Historical Factors: Prior suicide attempts and Family history of mental illness  Risk Reduction Factors:   Sense of responsibility to family, Employed, Living with another person, especially a relative, Positive social support and Positive coping skills or problem solving skills  Continued Clinical Symptoms:  Previous Psychiatric Diagnoses and Treatments Medical Diagnoses and Treatments  Cognitive Features That Contribute To Risk:  None    Suicide Risk:  Risk factors: Prior suicide attempt, history of bipolar disorder, family history of bipolar disorder Protective factors: Patient denies suicidal ideation, patient denies homicidal ideation, patient denies hopelessness, patient is willing to seek treatment, patient denies access to or ownership of guns Acute suicide risk: Low Chronic suicide risk: low to moderate  Crisis/safety plan: Talk to family or friends, call 911, return to the  Emergency Department/Hospital, call crisis hotlines (handouts provided to patient at discharge)  Discharge Medications:   Morning Afternoon Evening Bedtime As Needed    amLODipine-olmesartan 10-40 MG tablet  Commonly known as: AZOR  Take 1 tablet by mouth daily.  For: High Blood Pressure Disorder   Next does due:           levothyroxine 50 MCG tablet  Commonly known as: SYNTHROID, LEVOTHROID  Start taking on: 03/11/2018  Take 1 tablet (50 mcg total) by mouth daily before breakfast.  For: Underactive Thyroid   Next does due:           QUEtiapine 100 MG tablet  Commonly known as: SEROQUEL  Take 1.5 tablets (150 mg total) by mouth at bedtime.  What changed: how much to take  For: Manic Phase of Manic-Depression   Next does due:                Follow-up Information    [facility] Go on 03/11/2018.   Why:  Please meet Unk PintoHarvey Bryant, Peer Support Specilaist, at [facility] on Wednesday, 03/11/18 at 7:15AM to begin Peer Support Services. Thank you! Contact information: [address] [place] [address] [phone]        [facility]. Go on 03/13/2018.   Why:  Please follow up with your primary care physician on Friday, 03/13/18 at 2:20PM. Thank you! Contact information: [facility] at [facility] [address] [place], [place]  [address]  [phone]  F: [phone]            Plan Of Care/Follow-up recommendations:  Activity:  as tolerated Diet:  heart healthy Other:  Follow up with primary care provider for hypothyroidism, hypertension, and recent Urinary tract infection  Hessie KnowsSarita O'Neal, MD 03/10/2018, 11:17 AM

## 2018-03-10 NOTE — Progress Notes (Signed)
Patient denies SI/HI, denies A/V hallucinations. Patient verbalizes understanding of discharge instructions, follow up care and prescriptions. Patient given all belongings from [facility] locker. Patient escorted out by staff, transported by family. 

## 2018-03-10 NOTE — Progress Notes (Signed)
Recreation Therapy Notes  INPATIENT RECREATION TR PLAN  Patient Details Name: Lindsey Sexton MRN: [id number] DOB: 03-08-1971 Today's Date: 03/10/2018  Rec Therapy Plan Is patient appropriate for Therapeutic Recreation?: Yes Treatment times per week: At least 3 Estimated Length of Stay: 5-7 days TR Treatment/Interventions: Group participation (Comment)  Discharge Criteria Pt will be discharged from therapy if:: Discharged Treatment plan/goals/alternatives discussed and agreed upon by:: Patient/family  Discharge Summary Short term goals set: Patient will identify 3 positive coping skills strategies to use post d/c within 5 recreation therapy group sessions Short term goals met: Complete Progress toward goals comments: Groups attended Which groups?: Leisure education, Other (Comment)(Values, Problem Solving, Creative expressions) Reason goals not met: N/A Therapeutic equipment acquired: N/A Reason patient discharged from therapy: Discharge from hospital Pt/family agrees with progress & goals achieved: Yes Date patient discharged from therapy: 03/10/18   Jameal Razzano 03/10/2018, 11:06 AM

## 2018-03-10 NOTE — Discharge Instructions (Signed)
Follow up with your outpatient psychiatric appointment.  Also follow up with your primary care provider for your blood pressure, hypothyroidism and recent Urinary Tract Infection.

## 2018-03-10 NOTE — Progress Notes (Signed)
Patient was visible in the milieu until bedtime. Alert and oriented but pensive and brief. Was seen in the dayroom and more involved in unit activities. Interacting with staff and peers appropriately. Had a snack and presented to the medication room. Denied medical concerns. Denies thoughts of self harm. Received HS medications and was encouraged to talk to staff as needed. Currently in bed resting and safety maintained.

## 2018-03-10 NOTE — Discharge Summary (Addendum)
Physician Discharge Summary Note  Patient:  Lindsey Sexton is an 47 y.o., female MRN:  [id number] DOB:  10/30/1970 Patient phone:  [phone] (home)  Patient address:   [address] [place] [address],   Total Time spent with patient, preparing patient's discharge, reviewing chart, and discussing patient with her treatment team: 40 min    Date of Admission:  03/04/2018 Date of Discharge: 03/10/18  Reason for Admission:  Manic behaviors  Principal Problem: Bipolar affective disorder, current episode manic with psychotic symptoms [facility]) Discharge Diagnoses: Patient Active Problem List   Diagnosis Date Noted  . UTI (urinary tract infection) [N39.0] 03/05/2018  . Confusion [R41.0] 03/04/2018  . Psychosis ([place]) [F29] 03/04/2018  . Bipolar disorder ([place]) [F31.9] 03/04/2018  . Hypothyroidism [E03.9] 03/04/2018  . Bipolar affective disorder, current episode manic with psychotic symptoms ([place]) [F31.2] 03/04/2018    Past Psychiatric History: Patient reports that she was first diagnosed with bipolar disorder in the late 1990s.  She states at that time she experienced a manic episode including overspending, having increased energy, overworking, and decreased sleep.  Patient reports she was hospitalized at [facility] in [place].  Patient states in 2012 she suffered a depressive episode and was hospitalized at [facility].  Patient has been on Zoloft since "late 2018" prescribed by her primary care provider.  She reports that she is up to Zoloft 150 mg daily.  Patient also states that she has taken Valium 10 mg nightly prescribed by her primary care provider as well.  Patient states she has been off Valium for the past 2 months.  Patient states in the late 1990s when she was first diagnosed with bipolar disorder she was started on lithium.  Patient has been off lithium for many years.  Patient currently does not have outpatient psychiatric care.    Past Medical History:  Past Medical  History:  Diagnosis Date  . Bipolar disorder ([place])   . Hypothyroidism     Past Surgical History:  Procedure Laterality Date  . TONSILLECTOMY AND ADENOIDECTOMY     Family History: History reviewed. No pertinent family history. Family Psychiatric  History:  Patient reports that her maternal great aunt had bipolar disorder.  Patient does not believe anyone in her family has attempted or completed a suicide attempt.   Social History:  Social History   Substance and Sexual Activity  Alcohol Use Not Currently     Social History   Substance and Sexual Activity  Drug Use Not Currently    Social History   Socioeconomic History  . Marital status: Married    Spouse name: Not on file  . Number of children: Not on file  . Years of education: Not on file  . Highest education level: Not on file  Occupational History  . Not on file  Social Needs  . Financial resource strain: Not on file  . Food insecurity:    Worry: Not on file    Inability: Not on file  . Transportation needs:    Medical: Not on file    Non-medical: Not on file  Tobacco Use  . Smoking status: Never Smoker  . Smokeless tobacco: Never Used  Substance and Sexual Activity  . Alcohol use: Not Currently  . Drug use: Not Currently  . Sexual activity: Yes    Birth control/protection: Surgical  Lifestyle  . Physical activity:    Days per week: Not on file    Minutes per session: Not on file  . Stress: Not on  file  Relationships  . Social connections:    Talks on phone: Not on file    Gets together: Not on file    Attends religious service: Not on file    Active member of club or organization: Not on file    Attends meetings of clubs or organizations: Not on file    Relationship status: Not on file  Other Topics Concern  . Not on file  Social History Narrative  . Not on file   Lindsey Sexton (patient's husband) [id number]  History of present illness and psychiatric review of symptoms from admission H&P  done on 03/05/18: Patient is a 47 year old female with a history of hypertension, hypothyroidism and bipolar disorder. Patient presents after playing symptoms concerning for mania. Patient reports that she was first diagnosed with bipolar disorder in the late 1990s. At that time manic episodes consisted of being "over energized", decreased sleep, "overworking", and overspending. Patient states she was hospitalized at that time and started on lithium. Patient reports that after few years of being on psychotropic medications she discontinued them on her own. Patient states she had been doing well until 2012, when she suffered a depressive episode and was started on Zoloft by her primary care provider for anxiety and depression. Patient states that she would also take Valium prescribed by her primary care provider at bedtime for sleep. Patient states that she stopped taking Valium a couple of months ago. She reports that she was consistently taking Zoloft 150 mg daily. Recently she started going to a weight loss clinic called [facility] Patient states that she was prescribed "a natural thyroid pill, phentermine, hCG injections, and some sort of supplement to decrease sweet cravings." Patient states she had been on this combination of supplements and weight loss medication from the clinic for approximately 10 days. Patient states she started to notice that she was decompensating and having difficulty with orientation. She states that her husband was out of town for approximately 3 days. She stated over that 3-day period, she had difficulty sleeping and stayed up the entire weekend cleaning. Once her husband returned, he was concerned about the patient's current state so he took her to [facility] psychiatric hospital, but the provider there reportedly told her that she may have a urinary tract infection so they recommended she go to the emergency department for medical clearance. Upon ED  presentation, patient's urinalysis showed large amount of leukocytes, mucus, and "rare" bacteria. Urine culture revealed E. coli. Susceptibilities to follow. However due to patient's continued erratic behavior and speech, the patient was admitted to the behavioral health unit for further evaluation and treatment.  I spoke with patient's husband who reported the same information that Social worker obtained from him as well: Husband(Craig Offenberger, pt's husband, at [phone]). See excerptfrom social worker's(Kelsey Craig)assessment below: Pt's husbandadded, "Last Tuesday she went in for a yearly check in with her primary care doctor. She also has a weight loss clinic in the same building, and they put her back on an appetite suppressant. I guess it gives her an energy boost too. She was extremely chatty on Wednesday/Thursday. I talked to her Friday afternoon and she was her normal self-I was out of town for work. When I got home Sunday, she was like she is now. Her train of thought was more clear then than it is now, though. She told me that she got into a cleaning fit and stayed up for twelve hours straight cleaning the house. In February, she  kind of had the same thing happen. She went into work early one morning at 4 or 5AM. She stayed there all day and all night. I called her several times and you could tell she was tired and said she had to finish what she was doing. I woke up the next morning and she still wasn't home. Her boss called me about noon, and said I needed to come get her. She showed some of the same signs that she did the other day. She would see something and fixate on it, like contact information on a sign or something. She'll latch on to certain phrases if she's talking, this time. She's very chatty with me, but isn't talking to doctors. She knows family names-but her son came over the other day and I asked her who that was-she said 69. He races cars and his number is 12. I  thought maybe she was just exhausted and got her to sleep good Monday night and some naps on Tuesday. But, Monday it wasn't really better, her mother who has been a nurse said she needed to get in somewhere. She went to her doctor on Tuesday, and after the doctor spoke to her for a little bit, she said she needed labs and recommended we go to the ER. A mental health evaluator came out with the EMTs and they recommended we go to [facility] was a complete waste of time. They thought she was having a medical issue, so we came back home. We were here for about an hour, before we ended up coming over to the ER with you all. When this happened in February, they did a bunch of tests and said that her thyroid was all out of whack. So, they did the test again this time, and I was hoping that was the issue, but apparently it wasn't. When I spoke to her mother, she said about eight years ago she had an episode like this then. That was before I met her. She hasn't seen a psychiatrist in about eight years. I think she probably thought she didn't have any issues anymore so she didn't need medication. I know she also hasn't been eating pretty much at all since Monday morning. I was only able to get her to eat a few Cheerios on Monday."    Associated Signs/Symptoms: Depression Symptoms:  pt denies feeling depressed, pt states that her appetite has been low. Since being hospitalized patient states she's eaten a little at each meal.   (Hypo) Manic Symptoms:  Distractibility, Elevated Mood, Flight of Ideas, goal directed activity, staying up cleaning for 12 hours, decreased sleep Anxiety Symptoms:  pt denies Psychotic Symptoms:  pt denies AH/VH PTSD Symptoms: none voiced  Eating disorder symptoms: Patient denies history of vomiting, restricting, laxative or diuretic use.      Hospital Course:  Patient is a 47 yo female who was admitted after experiencing manic behaviors.  Patient was started on  seroquel nightly-dose titrated to 150 mg nightly for mood stabilization.  She responded to the low dose of seroquel.  Her mood stabilized.  Her thought process became coherent.  She participated in the therapeutic milieu of the unit.  She attended groups and was medication compliant.    Medically, the patient was treated for a Urinary Tract Infection (e.coli infection) with one dose of IV rocephin in the ED, then given oral bactrim DS x 3 days.  Patient was continued on blood pressure medications, but of note, while in the  hospital setting she was provided with norvasc-irbesartan.  But at discharge she was instructed to resume her home antihypertensive (see discharge med list below).  For hypothyroidism, she was started on 50 mcg of synthroid.  She will need follow up with her primary care provider [facility]) for hypertension, hypothyroidism, and recent UTI.    Patient was discharged in improved condition.  She is completely oriented.  She is motivated to continue with psychiatric treatment at discharge.  I spoke with the patient's husband who also was pleased with her progress and believes she is ready for discharge as well.  On day of discharge, the patient denied suicidal ideation, homicidal ideation, auditory or visual hallucinations and paranoia.  She has psychiatric follow up at [facility].      Suicide Risk:  Risk factors: Prior suicide attempt, history of bipolar disorder, family history of bipolar disorder Protective factors: Patient denies suicidal ideation, patient denies homicidal ideation, patient denies hopelessness, patient is willing to seek treatment, patient denies accessto or ownership of guns Acute suicide risk: Low Chronic suicide risk: low to moderate  Crisis/safety plan: Talk to family or friends, call 911, return to the Emergency Department/Hospital, call crisis hotlines (handouts provided to patient at discharge)   Physical Findings: AIMS: 0 CIWA:  0  Musculoskeletal: Strength & Muscle Tone: within normal limits Gait & Station: normal Patient leans: N/A    Psychiatric Specialty Exam: Physical Exam  Nursing note and vitals reviewed.   Psychiatric Specialty Exam: Review of Systems  Constitutional: Negative for chills and fever.  HENT: Negative for congestion, ear discharge, ear pain, sinus pain and sore throat.   Eyes: Negative for blurred vision, double vision, pain, discharge and redness.  Respiratory: Negative for cough, shortness of breath and wheezing.   Cardiovascular: Negative for chest pain and leg swelling.  Gastrointestinal: Negative for abdominal pain, constipation, diarrhea, nausea and vomiting.  Genitourinary: Negative for dysuria and urgency.  Musculoskeletal: Negative for back pain, falls, joint pain, myalgias and neck pain.  Skin: Negative for itching and rash.  Neurological: Negative for dizziness, tingling, tremors, seizures, loss of consciousness, weakness and headaches.  Psychiatric/Behavioral: Negative for depression, hallucinations, memory loss, substance abuse and suicidal ideas. The patient is not nervous/anxious and does not have insomnia.     Blood pressure 107/81, pulse 91, temperature 97.9 F (36.6 C), temperature source Oral, resp. rate 18, height '5\' 6"'$  (1.676 m), weight 84.4 kg (186 lb), SpO2 100 %.Body mass index is 30.02 kg/m.  General Appearance: Well Groomed, casually and neatly dressed; hygiene good.   Eye Contact::  Good  Speech:  Normal Rate409  Volume:  Normal  Mood:  "positive"  Affect:  Full Range  Thought Process:  Coherent and Linear  Orientation:  Full (Time, Place, and Person)  Thought Content:  Logical  Suicidal Thoughts:  No; patient denies  Homicidal Thoughts:  No: patient denies  Memory:  intact  Judgement:  Good  Insight:  Good  Psychomotor Activity:  Normal  Concentration:  Good  Recall:  Good  Fund of Knowledge:Good  Language: Good  Akathisia:  No  Handed:   Right  AIMS (if indicated):   0  Assets:  Communication Skills Desire for Improvement Financial Resources/Insurance Housing Physical Health Resilience Social Support Transportation Vocational/Educational  Sleep:  Number of Hours: 7  Cognition: WNL  ADL's:  Intact     Have you used any form of tobacco in the last 30 days? (Cigarettes, Smokeless Tobacco, Cigars, and/or Pipes): No  Has this patient  used any form of tobacco in the last 30 days? (Cigarettes, Smokeless Tobacco, Cigars, and/or Pipes) Yes, No  Blood Alcohol level:  Lab Results  Component Value Date   ETH <10 01/60/1093    Metabolic Disorder Labs:  Lab Results  Component Value Date   HGBA1C 5.6 03/05/2018   MPG 114.02 03/05/2018   No results found for: PROLACTIN Lab Results  Component Value Date   CHOL 271 (H) 03/05/2018   TRIG 141 03/05/2018   HDL 34 (L) 03/05/2018   CHOLHDL 8.0 03/05/2018   VLDL 28 03/05/2018   LDLCALC 209 (H) 03/05/2018    See Psychiatric Specialty Exam and Suicide Risk Assessment completed by Attending Physician prior to discharge.  Discharge destination:  Home  Is patient on multiple antipsychotic therapies at discharge:  No     Discharge Instructions    Diet - low sodium heart healthy   Complete by:  As directed    Increase activity slowly   Complete by:  As directed      Allergies as of 03/10/2018   No Known Allergies     Medication List    STOP taking these medications   cephALEXin 500 MG capsule Commonly known as:  KEFLEX     TAKE these medications     Indication  amLODipine-olmesartan 10-40 MG tablet Commonly known as:  AZOR Take 1 tablet by mouth daily.  Indication:  High Blood Pressure Disorder   levothyroxine 50 MCG tablet Commonly known as:  SYNTHROID, LEVOTHROID Take 1 tablet (50 mcg total) by mouth daily before breakfast. Start taking on:  03/11/2018  Indication:  Underactive Thyroid   QUEtiapine 100 MG tablet Commonly known as:  SEROQUEL Take  1.5 tablets (150 mg total) by mouth at bedtime. What changed:  how much to take  Indication:  Manic Phase of Manic-Depression      Follow-up Information    [facility] on 03/11/2018.   Why:  Please meet Sherrian Divers, Peer Support Specilaist, at [facility] on Wednesday, 03/11/18 at 7:15AM to begin Peer Support Services. Thank you! Contact information: [place] [address] [phone]        [facility]. Go on 03/13/2018.   Why:  Please follow up with your primary care physician on Friday, 03/13/18 at 2:20PM. Thank you! Contact information: [place] at [facility] [address] [place], [place]  [address]  [phone]  F: [phone]            Follow-up recommendations:   Activity:  as tolerated Diet:  heart healthy Other:  Follow up with primary care provider for hypothyroidism, hypertension, and recent Urinary tract infection    Signed: Tennis Ship, MD 03/10/2018, 12:54 PM

## 2018-03-10 NOTE — [facility] Group Notes (Signed)
CSW Group Therapy Note  03/10/2018  Time:  0900  Type of Therapy and Topic: Group Therapy: Goals Group: SMART Goals    Participation Level:  Did Not Attend    Description of Group:   The purpose of a daily goals group is to assist and guide patients in setting recovery/wellness-related goals. The objective is to set goals as they relate to the crisis in which they were admitted. Patients will be using SMART goal modalities to set measurable goals. Characteristics of realistic goals will be discussed and patients will be assisted in setting and processing how one will reach their goal. Facilitator will also assist patients in applying interventions and coping skills learned in psycho-education groups to the SMART goal and process how one will achieve defined goal.    Therapeutic Goals:  -Patients will develop and document one goal related to or their crisis in which brought them into treatment.  -Patients will be guided by LCSW using SMART goal setting modality in how to set a measurable, attainable, realistic and time sensitive goal.  -Patients will process barriers in reaching goal.  -Patients will process interventions in how to overcome and successful in reaching goal.    Patient's Goal:  Pt was invited to attend group but chose not to attend. CSW will continue to encourage pt to attend group throughout their admission.   Therapeutic Modalities:  Motivational Interviewing  Cognitive Behavioral Therapy  Crisis Intervention Model  SMART goals setting  Heidi DachKelsey Delsy Etzkorn, MSW, LCSW Clinical Social Worker 03/10/2018 9:43 AM

## 2018-03-10 NOTE — Progress Notes (Signed)
  [facility]  Will you be returning to the same living situation after discharge:  Yes,  returning home At discharge, do you have transportation home?: Yes,  husband Do you have the ability to pay for your medications: Yes,  insurance  Release of information consent forms completed and in the chart;  Patient's signature needed at discharge.  Patient to Follow up at: Follow-up Information    [facility] Go on 03/11/2018.   Why:  Please meet Unk PintoHarvey Bryant, Peer Support Specilaist, [facility] Wednesday, 03/11/18 at 7:15AM to begin Peer Support Services. Thank you! Contact information: [address] [place] [address] [phone]        [facility]. Go on 03/13/2018.   Why:  Please follow up with your primary care physician on Friday, 03/13/18 at 2:20PM. Thank you! Contact information: [facility] [place] [address] [place], [place]  [address]  [phone]  F: [phone]            Next level of care provider has access to [facility] Link:no  Safety Planning and Suicide Prevention discussed: Yes,  with pt and her husband  Have you used any form of tobacco in the last 30 days? (Cigarettes, Smokeless Tobacco, Cigars, and/or Pipes): No  Has patient been referred to the Quitline?: N/A patient is not a smoker  Patient has been referred for addiction treatment: N/A  Heidi DachKelsey Elliot Simoneaux, LCSW 03/10/2018, 9:33 AM

## 2018-03-10 NOTE — Progress Notes (Signed)
Recreation Therapy Notes   Date: 03/10/2018  Time: 9:30 am  Location: Craft Room  Behavioral response: Appropriate    Intervention Topic: Values  Discussion/Intervention:  Group content today was focused on values. The group identified what values are and where they come from. Individuals expressed some values and how many they have. Patients described how they go about adding or removing values. The group described the importance of having values and how they go about using them in daily life. Patient participated in the intervention "My Values" where they were able to pick out values that were important to them and make a visual aide.  Clinical Observations/Feedback:  Patient came to group and defined values as positive things that we do a people. She explained that her values come from her family and her religion. Individual was social with peers and staff while participating in the intervention.  Any Mcneice LRT/CTRS          Caia Lofaro 03/10/2018 10:50 AM

## 2018-03-10 NOTE — [facility] Group Notes (Signed)
03/10/2018 1PM  Type of Therapy/Topic:  Group Therapy:  Feelings about Diagnosis  Participation Level:  Active   Description of Group:   This group will allow patients to explore their thoughts and feelings about diagnoses they have received. Patients will be guided to explore their level of understanding and acceptance of these diagnoses. Facilitator will encourage patients to process their thoughts and feelings about the reactions of others to their diagnosis and will guide patients in identifying ways to discuss their diagnosis with significant others in their lives. This group will be process-oriented, with patients participating in exploration of their own experiences, giving and receiving support, and processing challenge from other group members.   Therapeutic Goals: 1. Patient will demonstrate understanding of diagnosis as evidenced by identifying two or more symptoms of the disorder 2. Patient will be able to express two feelings regarding the diagnosis 3. Patient will demonstrate their ability to communicate their needs through discussion and/or role play  Summary of Patient Progress: Actively and appropriately engaged in the group. Patient was able to provide support and validation to other group members.Patient practiced active listening when interacting with the facilitator and other group members. Lindsey SimasJohanna mentions having a symptom of being "stressed to the point of confussion". Patient is still in the process of obtaining treatment goals.        Therapeutic Modalities:   Cognitive Behavioral Therapy Brief Therapy Feelings Identification    Lindsey ShearsCassandra  Lindsey Balducci, LCSW 03/10/2018 2:35 PM

## 2018-03-10 NOTE — [facility] Group Notes (Signed)
[facility] Group Notes:  (Nursing/MHT/Case Management/Adjunct)  Date:  03/10/2018  Time:  2:46 PM  Type of Therapy:  Psychoeducational Skills  Participation Level:  Active  Participation Quality:  Appropriate, Attentive and Sharing  Affect:  Appropriate  Cognitive:  Alert and Appropriate  Insight:  Appropriate  Engagement in Group:  Engaged  Modes of Intervention:  Discussion, Education and Support  Summary of Progress/Problems:  Lynelle SmokeCara Travis [facility] 03/10/2018, 2:46 PM

## 2018-03-10 NOTE — Plan of Care (Signed)
Visible in the milieu, calm and cooperative

## 2019-05-15 ENCOUNTER — Emergency Department
Admission: EM | Admit: 2019-05-15 | Discharge: 2019-05-16 | Disposition: A | Discharge status: Home / Self Care | Payer: BLUE CROSS/BLUE SHIELD | Source: Home / Self Care | Attending: Emergency Medicine | Admitting: Emergency Medicine

## 2019-05-15 ENCOUNTER — Encounter: Payer: Self-pay | Admitting: Emergency Medicine

## 2019-05-15 ENCOUNTER — Other Ambulatory Visit: Payer: Self-pay

## 2019-05-15 DIAGNOSIS — E039 Hypothyroidism, unspecified: Secondary | ICD-10-CM | POA: Insufficient documentation

## 2019-05-15 DIAGNOSIS — R5383 Other fatigue: Secondary | ICD-10-CM | POA: Insufficient documentation

## 2019-05-15 DIAGNOSIS — F308 Other manic episodes: Secondary | ICD-10-CM | POA: Insufficient documentation

## 2019-05-15 DIAGNOSIS — Z20828 Contact with and (suspected) exposure to other viral communicable diseases: Secondary | ICD-10-CM | POA: Insufficient documentation

## 2019-05-15 DIAGNOSIS — R410 Disorientation, unspecified: Secondary | ICD-10-CM | POA: Diagnosis not present

## 2019-05-15 DIAGNOSIS — F312 Bipolar disorder, current episode manic severe with psychotic features: Secondary | ICD-10-CM | POA: Diagnosis not present

## 2019-05-15 DIAGNOSIS — Z79899 Other long term (current) drug therapy: Secondary | ICD-10-CM | POA: Insufficient documentation

## 2019-05-15 LAB — COMPREHENSIVE METABOLIC PANEL
ALT: 25 U/L (ref 0–44)
AST: 32 U/L (ref 15–41)
Albumin: 4.6 g/dL (ref 3.5–5.0)
Alkaline Phosphatase: 58 U/L (ref 38–126)
Anion gap: 10 (ref 5–15)
BUN: 32 mg/dL — ABNORMAL HIGH (ref 6–20)
CO2: 23 mmol/L (ref 22–32)
Calcium: 9.7 mg/dL (ref 8.9–10.3)
Chloride: 104 mmol/L (ref 98–111)
Creatinine, Ser: 1.57 mg/dL — ABNORMAL HIGH (ref 0.44–1.00)
GFR calc Af Amer: 45 mL/min — ABNORMAL LOW (ref >60)
GFR calc non Af Amer: 39 mL/min — ABNORMAL LOW (ref >60)
Glucose, Bld: 127 mg/dL — ABNORMAL HIGH (ref 70–99)
Potassium: 3.8 mmol/L (ref 3.5–5.1)
Sodium: 137 mmol/L (ref 135–145)
Total Bilirubin: 0.8 mg/dL (ref 0.3–1.2)
Total Protein: 7.6 g/dL (ref 6.5–8.1)

## 2019-05-15 LAB — CBC
HCT: 37.4 % (ref 36.0–46.0)
Hemoglobin: 12.9 g/dL (ref 12.0–15.0)
MCH: 30.8 pg (ref 26.0–34.0)
MCHC: 34.5 g/dL (ref 30.0–36.0)
MCV: 89.3 fL (ref 80.0–100.0)
Platelets: 499 10*3/uL — ABNORMAL HIGH (ref 150–400)
RBC: 4.19 MIL/uL (ref 3.87–5.11)
RDW: 12.4 % (ref 11.5–15.5)
WBC: 13.9 10*3/uL — ABNORMAL HIGH (ref 4.0–10.5)
nRBC: 0 % (ref 0.0–0.2)

## 2019-05-15 LAB — ACETAMINOPHEN LEVEL: Acetaminophen (Tylenol), Serum: <10 ug/mL — ABNORMAL LOW (ref 10–30)

## 2019-05-15 LAB — TSH: TSH: 4.449 u[IU]/mL (ref 0.350–4.500)

## 2019-05-15 LAB — SALICYLATE LEVEL: Salicylate Lvl: <7 mg/dL (ref 2.8–30.0)

## 2019-05-15 LAB — ETHANOL: Alcohol, Ethyl (B): <10 mg/dL (ref <10)

## 2019-05-15 MED ORDER — AMLODIPINE BESYLATE 5 MG PO TABS: 5.0000 mg | ORAL_TABLET | Freq: Once | ORAL | Status: DC

## 2019-05-15 NOTE — ED Notes (Signed)
Pt given water and is drinking.

## 2019-05-15 NOTE — ED Notes (Signed)
First nurse note: pt presents with spouse, pt states she is stressed and tired. Husband states history of bipolar. Pt talking rapidly, is cooperative.

## 2019-05-15 NOTE — ED Notes (Signed)
Pt drank 2 cups of water. Pt to have consult with telepsych. Pt has cup for urine sample

## 2019-05-15 NOTE — ED Provider Notes (Signed)
[facility] Emergency Department Provider Note  ____________________________________________   First MD Initiated Contact with Patient 05/15/19 2051     (approximate)  I have reviewed the triage vital signs and the nursing notes.   HISTORY  Chief Complaint Anxiety, Fatigue, and Insomnia    HPI Lindsey Sexton is a 48 y.o. female  Here with desire for help. Pt reports that she is here because she wants help with finding the "right people" to talk to. She has a h/;o bipolar disorder and mania and has been hospitalized in the past. She states she has been off all her medications and is going "all natural." She admits to poor sleep, increased activity but has no SI, HI, or AVH. She does not desire to hurt herself or others. She states she is here because she'd like resources to help with her mood and to have someone to talk to, but that's it. Here with her husband who is aware she is here and of her underlying diagnoses.         Past Medical History:  Diagnosis Date  . Bipolar disorder ([place])   . Hypothyroidism     Patient Active Problem List   Diagnosis Date Noted  . UTI (urinary tract infection) 03/05/2018  . Confusion 03/04/2018  . Psychosis ([place]) 03/04/2018  . Bipolar disorder ([place]) 03/04/2018  . Hypothyroidism 03/04/2018  . Bipolar affective disorder, current episode manic with psychotic symptoms ([place]) 03/04/2018    Past Surgical History:  Procedure Laterality Date  . TONSILLECTOMY AND ADENOIDECTOMY      Prior to Admission medications   Medication Sig Start Date End Date Taking? Authorizing Provider  amLODipine-olmesartan (AZOR) 10-40 MG tablet Take 1 tablet by mouth daily.   Yes [provider]  levothyroxine (SYNTHROID, LEVOTHROID) 50 MCG tablet Take 1 tablet (50 mcg total) by mouth daily before breakfast. 03/11/18  Yes O'Neal, Fredirick Maudlin, MD  promethazine (PHENERGAN) 25 MG tablet Take 1 tablet by mouth as needed. 04/16/19  Yes [provider]    Allergies Patient has no known allergies.  History reviewed. No pertinent family history.  Social History Social History   Tobacco Use  . Smoking status: Never Smoker  . Smokeless tobacco: Never Used  Substance Use Topics  . Alcohol use: Not Currently  . Drug use: Not Currently    Review of Systems  Review of Systems  Constitutional: Positive for fatigue. Negative for chills and fever.  HENT: Negative for sore throat.   Respiratory: Negative for shortness of breath.   Cardiovascular: Negative for chest pain.  Gastrointestinal: Negative for abdominal pain.  Genitourinary: Negative for flank pain.  Musculoskeletal: Negative for neck pain.  Skin: Negative for rash and wound.  Allergic/Immunologic: Negative for immunocompromised state.  Neurological: Negative for weakness and numbness.  Hematological: Does not bruise/bleed easily.  Psychiatric/Behavioral: Positive for decreased concentration and sleep disturbance. The patient is nervous/anxious and is hyperactive.      ____________________________________________  PHYSICAL EXAM:      VITAL SIGNS: ED Triage Vitals  Enc Vitals Group     BP 05/15/19 2026 (!) 172/90     Pulse Rate 05/15/19 2026 91     Resp 05/15/19 2026 16     Temp 05/15/19 2026 98.9 F (37.2 C)     Temp Source 05/15/19 2026 Oral     SpO2 05/15/19 2026 100 %     Weight 05/15/19 2024 157 lb 12.8 oz (71.6 kg)     Height 05/15/19 2024 5\' 6"  (  1.676 m)     Head Circumference --      Peak Flow --      Pain Score 05/15/19 2030 0     Pain Loc --      Pain Edu? --      Excl. in GC? --      Physical Exam Vitals signs and nursing note reviewed.  Constitutional:      General: She is not in acute distress.    Appearance: She is well-developed.  HENT:     Head: Normocephalic and atraumatic.  Eyes:     Conjunctiva/sclera: Conjunctivae normal.  Neck:     Musculoskeletal: Neck supple.  Cardiovascular:     Rate and Rhythm: Normal rate  and regular rhythm.     Heart sounds: Normal heart sounds.  Pulmonary:     Effort: Pulmonary effort is normal. No respiratory distress.     Breath sounds: No wheezing.  Abdominal:     General: There is no distension.  Skin:    General: Skin is warm.     Capillary Refill: Capillary refill takes less than 2 seconds.     Findings: No rash.  Neurological:     Mental Status: She is alert and oriented to person, place, and time.     Motor: No abnormal muscle tone.  Psychiatric:        Mood and Affect: Mood is anxious.        Behavior: Behavior is hyperactive. Behavior is not agitated or aggressive.        Thought Content: Thought content is not paranoid. Thought content does not include homicidal or suicidal ideation.       ____________________________________________   LABS (all labs ordered are listed, but only abnormal results are displayed)  Labs Reviewed  COMPREHENSIVE METABOLIC PANEL - Abnormal; Notable for the following components:      Result Value   Glucose, Bld 127 (*)    BUN 32 (*)    Creatinine, Ser 1.57 (*)    GFR calc non Af Amer 39 (*)    GFR calc Af Amer 45 (*)    All other components within normal limits  ACETAMINOPHEN LEVEL - Abnormal; Notable for the following components:   Acetaminophen (Tylenol), Serum <10 (*)    All other components within normal limits  CBC - Abnormal; Notable for the following components:   WBC 13.9 (*)    Platelets 499 (*)    All other components within normal limits  ETHANOL  SALICYLATE LEVEL  TSH  URINE DRUG SCREEN, QUALITATIVE ([facility] ONLY)  URINALYSIS, COMPLETE (UACMP) WITH MICROSCOPIC    ____________________________________________  EKG:  None ________________________________________  RADIOLOGY All imaging, including plain films, CT scans, and ultrasounds, independently reviewed by me, and interpretations confirmed via formal radiology reads.  ED MD interpretation:   None  Official radiology report(s): No results  found.  ____________________________________________  PROCEDURES   Procedure(s) performed (including Critical Care):  Procedures  ____________________________________________  INITIAL IMPRESSION / MDM / ASSESSMENT AND PLAN / ED COURSE  As part of my medical decision making, I reviewed the following data within the electronic MEDICAL RECORD NUMBER Notes from prior ED visits and  Controlled Substance Database      *Lindsey Sexton was evaluated in Emergency Department on 05/16/2019 for the symptoms described in the history of present illness. She was evaluated in the context of the global COVID-19 pandemic, which necessitated consideration that the patient might be at risk for infection with the SARS-CoV-2 virus that causes  COVID-19. Institutional protocols and algorithms that pertain to the evaluation of patients at risk for COVID-19 are in a state of rapid change based on information released by regulatory bodies including the CDC and federal and state organizations. These policies and algorithms were followed during the patient's care in the ED.  Some ED evaluations and interventions may be delayed as a result of limited staffing during the pandemic.*      Medical Decision Making: 48 yo F here voluntarily seeking resources. Suspect she is hypomanic clinically. She does not appear paranoid, psychotic, and has no SI, HI, or apparent hallucinations. Labs do show possible AKI - refused fluids, will encourage PO hydration. She is otherwise non-toxic, hyperactive but not aggressive. Will consult Psychiatry for evaluation.  ____________________________________________  FINAL CLINICAL IMPRESSION(S) / ED DIAGNOSES  Final diagnoses:  Hypomania (HCC)     MEDICATIONS GIVEN DURING THIS VISIT:  Medications  amLODipine (NORVASC) tablet 5 mg (has no administration in time range)     ED Discharge Orders    None       Note:  This document was prepared using Dragon voice recognition software  and may include unintentional dictation errors.   Shaune PollackIsaacs, Rashon Rezek, MD 05/16/19 [phone]

## 2019-05-15 NOTE — ED Triage Notes (Addendum)
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

## 2019-05-16 ENCOUNTER — Encounter: Payer: Self-pay | Admitting: Emergency Medicine

## 2019-05-16 ENCOUNTER — Inpatient Hospital Stay: Admission: RE | Admit: 2019-05-16 | Payer: BLUE CROSS/BLUE SHIELD | Source: Intra-hospital | Admitting: Psychiatry

## 2019-05-16 ENCOUNTER — Inpatient Hospital Stay
Admit: 2019-05-16 | Discharge: 2019-05-19 | DRG: 885 | Disposition: A | Discharge status: Home / Self Care | Payer: BLUE CROSS/BLUE SHIELD | Attending: Psychiatry | Admitting: Psychiatry

## 2019-05-16 ENCOUNTER — Encounter: Payer: Self-pay | Admitting: Psychiatry

## 2019-05-16 ENCOUNTER — Emergency Department (EMERGENCY_DEPARTMENT_HOSPITAL)
Admission: EM | Admit: 2019-05-16 | Discharge: 2019-05-16 | Disposition: A | Discharge status: Other Acute Inpatient Hospital | Payer: BLUE CROSS/BLUE SHIELD | Source: Home / Self Care | Attending: Emergency Medicine | Admitting: Emergency Medicine

## 2019-05-16 DIAGNOSIS — Z20828 Contact with and (suspected) exposure to other viral communicable diseases: Secondary | ICD-10-CM | POA: Diagnosis present

## 2019-05-16 DIAGNOSIS — F312 Bipolar disorder, current episode manic severe with psychotic features: Secondary | ICD-10-CM | POA: Insufficient documentation

## 2019-05-16 DIAGNOSIS — Z046 Encounter for general psychiatric examination, requested by authority: Secondary | ICD-10-CM | POA: Insufficient documentation

## 2019-05-16 DIAGNOSIS — G4700 Insomnia, unspecified: Secondary | ICD-10-CM | POA: Diagnosis present

## 2019-05-16 DIAGNOSIS — E039 Hypothyroidism, unspecified: Secondary | ICD-10-CM | POA: Insufficient documentation

## 2019-05-16 DIAGNOSIS — F309 Manic episode, unspecified: Secondary | ICD-10-CM

## 2019-05-16 DIAGNOSIS — Z79899 Other long term (current) drug therapy: Secondary | ICD-10-CM | POA: Insufficient documentation

## 2019-05-16 DIAGNOSIS — R410 Disorientation, unspecified: Secondary | ICD-10-CM | POA: Diagnosis present

## 2019-05-16 DIAGNOSIS — F319 Bipolar disorder, unspecified: Secondary | ICD-10-CM | POA: Diagnosis present

## 2019-05-16 DIAGNOSIS — R451 Restlessness and agitation: Secondary | ICD-10-CM | POA: Insufficient documentation

## 2019-05-16 LAB — COMPREHENSIVE METABOLIC PANEL
ALT: 23 U/L (ref 0–44)
AST: 30 U/L (ref 15–41)
Albumin: 4 g/dL (ref 3.5–5.0)
Alkaline Phosphatase: 51 U/L (ref 38–126)
Anion gap: 11 (ref 5–15)
BUN: 27 mg/dL — ABNORMAL HIGH (ref 6–20)
CO2: 21 mmol/L — ABNORMAL LOW (ref 22–32)
Calcium: 9.2 mg/dL (ref 8.9–10.3)
Chloride: 103 mmol/L (ref 98–111)
Creatinine, Ser: 1.36 mg/dL — ABNORMAL HIGH (ref 0.44–1.00)
GFR calc Af Amer: 54 mL/min — ABNORMAL LOW (ref >60)
GFR calc non Af Amer: 47 mL/min — ABNORMAL LOW (ref >60)
Glucose, Bld: 129 mg/dL — ABNORMAL HIGH (ref 70–99)
Potassium: 3.6 mmol/L (ref 3.5–5.1)
Sodium: 135 mmol/L (ref 135–145)
Total Bilirubin: 0.8 mg/dL (ref 0.3–1.2)
Total Protein: 6.8 g/dL (ref 6.5–8.1)

## 2019-05-16 LAB — URINALYSIS, ROUTINE W REFLEX MICROSCOPIC
Bilirubin Urine: NEGATIVE
Glucose, UA: NEGATIVE mg/dL
Hgb urine dipstick: NEGATIVE
Ketones, ur: 20 mg/dL — AB
Leukocytes,Ua: NEGATIVE
Nitrite: NEGATIVE
Protein, ur: NEGATIVE mg/dL
Specific Gravity, Urine: 1.01 (ref 1.005–1.030)
pH: 5 (ref 5.0–8.0)

## 2019-05-16 LAB — CBC WITH DIFFERENTIAL/PLATELET
Abs Immature Granulocytes: 0.04 10*3/uL (ref 0.00–0.07)
Basophils Absolute: 0 10*3/uL (ref 0.0–0.1)
Basophils Relative: 0 %
Eosinophils Absolute: 0 10*3/uL (ref 0.0–0.5)
Eosinophils Relative: 0 %
HCT: 34.5 % — ABNORMAL LOW (ref 36.0–46.0)
Hemoglobin: 11.6 g/dL — ABNORMAL LOW (ref 12.0–15.0)
Immature Granulocytes: 0 %
Lymphocytes Relative: 31 %
Lymphs Abs: 3.4 10*3/uL (ref 0.7–4.0)
MCH: 29.8 pg (ref 26.0–34.0)
MCHC: 33.6 g/dL (ref 30.0–36.0)
MCV: 88.7 fL (ref 80.0–100.0)
Monocytes Absolute: 0.8 10*3/uL (ref 0.1–1.0)
Monocytes Relative: 7 %
Neutro Abs: 6.6 10*3/uL (ref 1.7–7.7)
Neutrophils Relative %: 62 %
Platelets: 421 10*3/uL — ABNORMAL HIGH (ref 150–400)
RBC: 3.89 MIL/uL (ref 3.87–5.11)
RDW: 12.4 % (ref 11.5–15.5)
WBC: 10.8 10*3/uL — ABNORMAL HIGH (ref 4.0–10.5)
nRBC: 0 % (ref 0.0–0.2)

## 2019-05-16 LAB — URINE DRUG SCREEN, QUALITATIVE (ARMC ONLY)
Amphetamines, Ur Screen: NOT DETECTED
Barbiturates, Ur Screen: NOT DETECTED
Benzodiazepine, Ur Scrn: NOT DETECTED
Cannabinoid 50 Ng, Ur ~~LOC~~: NOT DETECTED
Cocaine Metabolite,Ur ~~LOC~~: NOT DETECTED
MDMA (Ecstasy)Ur Screen: NOT DETECTED
Methadone Scn, Ur: NOT DETECTED
Opiate, Ur Screen: NOT DETECTED
Phencyclidine (PCP) Ur S: NOT DETECTED
Tricyclic, Ur Screen: NOT DETECTED

## 2019-05-16 LAB — SALICYLATE LEVEL: Salicylate Lvl: <7 mg/dL (ref 2.8–30.0)

## 2019-05-16 LAB — T4, FREE: Free T4: 0.99 ng/dL (ref 0.61–1.12)

## 2019-05-16 LAB — ACETAMINOPHEN LEVEL: Acetaminophen (Tylenol), Serum: <10 ug/mL — ABNORMAL LOW (ref 10–30)

## 2019-05-16 LAB — MAGNESIUM: Magnesium: 1.9 mg/dL (ref 1.7–2.4)

## 2019-05-16 LAB — ETHANOL: Alcohol, Ethyl (B): <10 mg/dL (ref <10)

## 2019-05-16 LAB — SARS CORONAVIRUS 2 BY RT PCR (HOSPITAL ORDER, PERFORMED IN ~~LOC~~ HOSPITAL LAB): SARS Coronavirus 2: NEGATIVE

## 2019-05-16 MED ORDER — DROPERIDOL 2.5 MG/ML IJ SOLN
5.0000 mg | Freq: Once | INTRAMUSCULAR | Status: AC
  Administered 2019-05-16: 5 mg via INTRAMUSCULAR

## 2019-05-16 MED ORDER — LEVOTHYROXINE SODIUM 50 MCG PO TABS: 50.0000 ug | ORAL_TABLET | Freq: Every day | ORAL | Status: DC

## 2019-05-16 MED ORDER — ALUM & MAG HYDROXIDE-SIMETH 200-200-20 MG/5ML PO SUSP: 30.0000 mL | ORAL | Status: DC | PRN

## 2019-05-16 MED ORDER — OLANZAPINE 10 MG PO TABS
10.0000 mg | ORAL_TABLET | Freq: Two times a day (BID) | ORAL | Status: DC
  Administered 2019-05-16 – 2019-05-17 (×2): 10 mg via ORAL
  Filled 2019-05-16 (×2): qty 1

## 2019-05-16 MED ORDER — LEVOTHYROXINE SODIUM 50 MCG PO TABS
50.0000 ug | ORAL_TABLET | Freq: Every day | ORAL | Status: DC
  Administered 2019-05-17 – 2019-05-19 (×3): 50 ug via ORAL
  Filled 2019-05-16 (×3): qty 1

## 2019-05-16 MED ORDER — ACETAMINOPHEN 325 MG PO TABS: 650.0000 mg | ORAL_TABLET | Freq: Four times a day (QID) | ORAL | Status: DC | PRN

## 2019-05-16 MED ORDER — MAGNESIUM HYDROXIDE 400 MG/5ML PO SUSP: 30.0000 mL | Freq: Every day | ORAL | Status: DC | PRN

## 2019-05-16 NOTE — ED Provider Notes (Signed)
-----------------------------------------   12:16 AM on 05/16/2019 -----------------------------------------   Blood pressure (!) 172/90, pulse 91, temperature 98.9 F (37.2 C), temperature source Oral, resp. rate 16, height 1.676 m (5\' 6" ), weight 71.6 kg, SpO2 100 %.  The patient is calm and cooperative at this time although she continues to have a great deal of energy.  I spoke by phone with the psychiatrist on call who evaluated the patient.  She believes that she is safe to go home and does not meet any criteria for involuntary commitment nor inpatient treatment.  She believes that the patient's presentation and hypomanic state (or at least borderline hypomanic) is due to the patient's long-term treatment with phentermine and possibly one other weight loss drug.  The patient has been counseled about decreasing her dosage or stopping this altogether but she is not interested in doing so.  The patient has the capacity make her own decisions and will be discharged for outpatient follow-up and given psychiatric resources (follow-up is available at [facility]).   Hinda Kehr, MD 05/16/19 [phone]

## 2019-05-16 NOTE — Tx Team (Signed)
Initial Treatment Plan 05/16/2019 3:53 PM Nadirah Socorro [id number]    PATIENT STRESSORS: Other: Psychosis, Medication Non-compliance   PATIENT STRENGTHS: Ability for insight Active sense of humor Average or above average intelligence Capable of independent living Communication skills Financial means General fund of knowledge Motivation for treatment/growth Physical Health Religious Affiliation Special hobby/interest Supportive family/friends Work skills   PATIENT IDENTIFIED PROBLEMS: Psychosis 05/16/2019  Medication Non-Compliance 05/16/2019  Stress 05/16/2019                 DISCHARGE CRITERIA:  Improved stabilization in mood, thinking, and/or behavior Motivation to continue treatment in a less acute level of care Need for constant or close observation no longer present Verbal commitment to aftercare and medication compliance  PRELIMINARY DISCHARGE PLAN: Outpatient therapy Return to previous living arrangement Return to previous work or school arrangements  PATIENT/FAMILY INVOLVEMENT: This treatment plan has been presented to and reviewed with the patient, Josilyn Shippee.The patient has been given the opportunity to ask questions and make suggestions.  Reyes Ivan, RN 05/16/2019, 3:53 PM

## 2019-05-16 NOTE — Plan of Care (Signed)
Patient just recently admitted to the unit. Patient has not had sufficient time to show progressions at this time. Will continue to monitor for progressions.   Problem: Education: Goal: Knowledge of [facility] Education information/materials will improve Outcome: Not Progressing Goal: Emotional status will improve Outcome: Not Progressing Goal: Mental status will improve Outcome: Not Progressing   Problem: Safety: Goal: Periods of time without injury will increase Outcome: Not Progressing   Problem: Education: Goal: Will be free of psychotic symptoms Outcome: Not Progressing   Problem: Health Behavior/Discharge Planning: Goal: Compliance with prescribed medication regimen will improve Outcome: Not Progressing

## 2019-05-16 NOTE — ED Provider Notes (Signed)
[facility] Emergency Department Provider Note  ____________________________________________   First MD Initiated Contact with Patient 05/16/19 0559     (approximate)  I have reviewed the triage vital signs and the nursing notes.   HISTORY  Chief Complaint Mental Health Problem  Level 5 caveat:  history/ROS limited by acute/critical illness  HPI Lindsey Sexton is a 48 y.o. female with medical history as listed below  which notably includes bipolar disorder as well as long-term use of phentermine as a weight loss medication.  She was seen in this emergency department for agitation and possible mania less than 12 hours ago.  She was evaluated by psychiatry on call and determined to not meet any criteria for involuntary commitment nor inpatient treatment and she was recommended to not use the phentermine or at least to decrease the dose and follow-up as an outpatient with psychiatry.  She presents this morning under involuntary commitment for property damage to her house.  Reportedly she continued to act manic all night and began to tear up the house, "acted crazy", and represents a danger to herself.  She came in yelling and combative with law enforcement, repeating the same phrases over and over again such as reciting the branches of the arm services and yelling that she has "the solution to all the world's problems" and is unresponsive to any specific questions and will not respond to verbal redirection.        Past Medical History:  Diagnosis Date   Bipolar disorder [facility])    Hypothyroidism     Patient Active Problem List   Diagnosis Date Noted   UTI (urinary tract infection) 03/05/2018   Confusion 03/04/2018   Psychosis (HCC) 03/04/2018   Bipolar disorder (HCC) 03/04/2018   Hypothyroidism 03/04/2018   Bipolar affective disorder, current episode manic with psychotic symptoms (HCC) 03/04/2018    Past Surgical History:  Procedure Laterality  Date   TONSILLECTOMY AND ADENOIDECTOMY      Prior to Admission medications   Medication Sig Start Date End Date Taking? Authorizing Provider  amLODipine-olmesartan (AZOR) 10-40 MG tablet Take 1 tablet by mouth daily.    [provider]  levothyroxine (SYNTHROID, LEVOTHROID) 50 MCG tablet Take 1 tablet (50 mcg total) by mouth daily before breakfast. 03/11/18   Hessie Knows, MD  promethazine (PHENERGAN) 25 MG tablet Take 1 tablet by mouth as needed. 04/16/19   [provider]    Allergies Patient has no known allergies.  History reviewed. No pertinent family history.  Social History Social History   Tobacco Use   Smoking status: Never Smoker   Smokeless tobacco: Never Used  Substance Use Topics   Alcohol use: Not Currently   Drug use: Not Currently    Review of Systems Level 5 caveat:  history/ROS limited by acute/critical illness ____________________________________________   PHYSICAL EXAM:    Constitutional: Alert and agitated, manic versus psychotic, lacks capacity to make any of her own decisions. Eyes: Conjunctivae are normal.  Head: Atraumatic. Nose: No congestion/rhinnorhea. Mouth/Throat: Mucous membranes are moist. Neck: No stridor.  No meningeal signs.   Cardiovascular: Tachycardia, regular rhythm. Good peripheral circulation. Grossly normal heart sounds. Respiratory: Normal respiratory effort.  No retractions. Gastrointestinal: Soft and nontender. No distention.  Musculoskeletal: No lower extremity tenderness nor edema. No gross deformities of extremities. Neurologic:  Normal speech and language. No gross focal neurologic deficits are appreciated.  Skin:  Skin is warm, dry and intact. Psychiatric: Initially combative with law enforcement, perseverative, yelling, seeming to be  manic versus psychotic.  ____________________________________________   LABS (all labs ordered are listed, but only abnormal results are displayed)  Labs  Reviewed  CBC WITH DIFFERENTIAL/PLATELET - Abnormal; Notable for the following components:      Result Value   WBC 10.8 (*)    Hemoglobin 11.6 (*)    HCT 34.5 (*)    Platelets 421 (*)    All other components within normal limits  COMPREHENSIVE METABOLIC PANEL - Abnormal; Notable for the following components:   CO2 21 (*)    Glucose, Bld 129 (*)    BUN 27 (*)    Creatinine, Ser 1.36 (*)    GFR calc non Af Amer 47 (*)    GFR calc Af Amer 54 (*)    All other components within normal limits  MAGNESIUM  ETHANOL  URINALYSIS, ROUTINE W REFLEX MICROSCOPIC  URINE DRUG SCREEN, QUALITATIVE ([facility] ONLY)  ACETAMINOPHEN LEVEL  SALICYLATE LEVEL  T4, FREE   ____________________________________________  EKG  ED ECG REPORT I, Hinda Kehr, the attending physician, personally viewed and interpreted this ECG.  Date: 05/16/2019 EKG Time: 6:16 AM Rate: 83 Rhythm: normal sinus rhythm QRS Axis: normal Intervals: normal ST/T Wave abnormalities: normal Narrative Interpretation: no evidence of acute ischemia   ____________________________________________  RADIOLOGY I, Hinda Kehr, personally viewed and evaluated these images (plain radiographs) as part of my medical decision making, as well as reviewing the written report by the radiologist.  ED MD interpretation:  No indication for emergent imaging  Official radiology report(s): No results found.  ____________________________________________   PROCEDURES   Procedure(s) performed (including Critical Care):  .Critical Care Performed by: Hinda Kehr, MD Authorized by: Hinda Kehr, MD   Critical care provider statement:    Critical care time (minutes):  30   Critical care time was exclusive of:  Separately billable procedures and treating other patients   Critical care was necessary to treat or prevent imminent or life-threatening deterioration of the following conditions: psychiatric emergency.   Critical care was time spent  personally by me on the following activities:  Development of treatment plan with patient or surrogate, discussions with consultants, evaluation of patient's response to treatment, examination of patient, obtaining history from patient or surrogate, ordering and performing treatments and interventions, ordering and review of laboratory studies, ordering and review of radiographic studies, pulse oximetry, re-evaluation of patient's condition and review of old charts     ____________________________________________   [place] / MDM / [place] / ED COURSE  As part of my medical decision making, I reviewed the following data within the [place] notes reviewed and incorporated, Labs reviewed , EKG interpreted , Old EKG reviewed, Old chart reviewed, Patient signed out to , A consult was requested from this/these consultant(s) Psychiatry, Notes from prior ED visits and [place] Controlled Substance Database and transferred ED care to Dr. Jari Pigg.   Differential diagnosis includes, but is not limited to, mania, psychosis, thyroid storm or other manifestation of hyperthyroidism, substance-induced mood disorder, amphetamine use or other nonspecific drug use.  The patient arrived out-of-control, under involuntary commitment, and representing acute danger to herself and others.  She required a calming agent and after reviewing an old EKG showing a normal QTc interval, I ordered droperidol 5 mg intramuscular.  Within minutes the patient was safely asleep but responding appropriately to stimuli.  IV was established and blood is being sent.  The patient needs in and out catheterization for urine; during her prior visit she was  unwilling or unable to provide a urine specimen but I think it is important to check for signs of infection as well as to check a urine drug screen for possible amphetamine and/or cocaine use.  I have ordered standard psychiatry labs as well as a TSH  and free T4 level.  She has a history of hypothyroidism but it is possible she may be abusing levothyroxine as well as using phentermine.  Patient is under involuntary commitment and will need in person psychiatry consult.  Transferring ED care to Dr. Fuller PlanFunke to follow up on labs and psychiatry consult.            ____________________________________________  FINAL CLINICAL IMPRESSION(S) / ED DIAGNOSES  Final diagnoses:  Mania (HCC)     MEDICATIONS GIVEN DURING THIS VISIT:  Medications  droperidol (INAPSINE) 2.5 MG/ML injection 5 mg (5 mg Intramuscular Given 05/16/19 [id number])     ED Discharge Orders    None      *Please note:  Ruffin FrederickJohanna Hovanec was evaluated in Emergency Department on 05/16/2019 for the symptoms described in the history of present illness. She was evaluated in the context of the global COVID-19 pandemic, which necessitated consideration that the patient might be at risk for infection with the SARS-CoV-2 virus that causes COVID-19. Institutional protocols and algorithms that pertain to the evaluation of patients at risk for COVID-19 are in a state of rapid change based on information released by regulatory bodies including the CDC and federal and state organizations. These policies and algorithms were followed during the patient's care in the ED.  Some ED evaluations and interventions may be delayed as a result of limited staffing during the pandemic.*  Note:  This document was prepared using Dragon voice recognition software and may include unintentional dictation errors.   Loleta RoseForbach, Gerson Fauth, MD 05/16/19 [phone]

## 2019-05-16 NOTE — ED Provider Notes (Signed)
Patient handed off pending UA and thyroid levels.  Both are within normal limits.  Patient cleared for psychiatric disposition.  Sounds like they plan to admit patient.    Vanessa Blawenburg, MD 05/16/19 [phone]
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
## 2019-05-16 NOTE — BH Assessment (Signed)
Patient is to be admitted to [facility] by Psychiatric Nurse Practitioner Cristofona.  Attending Physician will be Dr. Weber Cooks.   Patient has been assigned to room 311, by [facility] Charge Nurse Demetria.   ER staff is aware of the admission:  Ronnie, ER Secretary    Dr. Jari Pigg, ER MD   Amy B., Patient's Nurse   Levada Dy, Patient Access.
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
## 2019-05-16 NOTE — Discharge Instructions (Addendum)
You have been seen in the Emergency Department (ED) today for a psychiatric complaint.  You have been evaluated by psychiatry and we believe you are safe to be discharged from the hospital.  We believe your symptoms may be the result of the phentermine you are taking from your regular doctor.  Please consider talking to your provider about cutting back on your dose.  Please return to the ED immediately if you have ANY thoughts of hurting yourself or anyone else, so that we may help you.  Please avoid alcohol and drug use.  Follow up with your doctor and/or therapist as soon as possible regarding today's ED visit.   Please follow up any other recommendations and clinic appointments provided by the psychiatry team that saw you in the Emergency Department.

## 2019-05-16 NOTE — ED Triage Notes (Signed)
"  I'm an expert. I can solve the world's problems"; "[employer], Therapist, art, [employer]. [employer], I know who to call" "NANA"; pt continually shouting the same things; pt seen here earlier tonight for fatigue, anxiety and insomnia; discharged home and even spoke to this RN when she left, saying she was feeling better; pt returns with [place] [employer] officers x 2 and IVC papers;
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
## 2019-05-16 NOTE — ED Notes (Signed)
Pt discharged to behavioral health with officer and ED staff. Pt appears calm and compliant. Pt states she is feeling good.

## 2019-05-17 DIAGNOSIS — F312 Bipolar disorder, current episode manic severe with psychotic features: Principal | ICD-10-CM

## 2019-05-17 MED ORDER — QUETIAPINE FUMARATE 200 MG PO TABS
200.0000 mg | ORAL_TABLET | Freq: Every day | ORAL | Status: DC
  Administered 2019-05-17 – 2019-05-18 (×2): 200 mg via ORAL
  Filled 2019-05-17 (×2): qty 1

## 2019-05-17 NOTE — H&P (Signed)
Psychiatric Admission Assessment Adult  Patient Identification: Lindsey FrederickJohanna Dossett MRN:  [id number] Date of Evaluation:  05/17/2019 Chief Complaint:  Anxiety  Principal Diagnosis: Bipolar affective disorder, current episode manic with psychotic symptoms (HCC) Diagnosis:  Principal Problem:   Bipolar affective disorder, current episode manic with psychotic symptoms (HCC) Active Problems:   Affective psychosis, bipolar (HCC)  History of Present Illness: Patient seen chart reviewed.  This is a patient with a history of bipolar disorder with widely interspersed episodes of mania.  She was brought to the hospital this time because her family noticed that she had become confused and agitated.  Reportedly they watched her sit for several hours at a time doing stereotyped motions and saying the same phrases over and over again.  Patient was evidently disorganized and not a good historian on presentation.  To me today she seems significantly improved.  She said that she got "exhausted" and "overwhelmed" and "spiraled".  She had not been sleeping much recently.  When I asked her about what the stresses were that were so overwhelming she mostly mentioned her work.  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  When I expressed sympathy for that she reflexively told me how much she enjoys the work.  Apparently she and her husband also have been threatened with eviction and have not been able to find a new apartment.  Again when I expressed sympathy for this she immediately minimized it.  She could not name any other particular stressor.  She has only partial memory of the behavior that brought her into the hospital.  Admits that she had not been sleeping well for days.  Had not been on any psychiatric medicine.  Stopped taking her Seroquel back in the spring when she was no longer able to get the medicine from her primary care doctor.  Denies any alcohol or drug  abuse Associated Signs/Symptoms: Depression Symptoms:  insomnia, psychomotor agitation, difficulty concentrating, (Hypo) Manic Symptoms:  Distractibility, Impulsivity, Anxiety Symptoms:  Excessive Worry, Psychotic Symptoms:  Ideas of Reference, PTSD Symptoms: Negative Total Time spent with patient: 1 hour  Past Psychiatric History: Patient has been diagnosed with bipolar disorder and has had several hospitalizations going back to the 1990s.  She was here at our hospital most recently about a year ago with a manic episode that also cleared up pretty quickly on 150 mg of Seroquel.  She denies having ever had a serious major depression.  Denies any history of suicide attempts.  Is the patient at risk to self? Yes.    Has the patient been a risk to self in the past 6 months? No.  Has the patient been a risk to self within the distant past? Yes.    Is the patient a risk to others? No.  Has the patient been a risk to others in the past 6 months? No.  Has the patient been a risk to others within the distant past? No.   Prior Inpatient Therapy:   Prior Outpatient Therapy:    Alcohol Screening: 1. How often do you have a drink containing alcohol?: Never 2. How many drinks containing alcohol do you have on a typical day when you are drinking?: 1 or 2 3. How often do you have six or more drinks on one occasion?: Never AUDIT-C Score: 0 4. How often during the last year have you found that you were not able to stop drinking once you had started?: Never  5. How often during the last year have you failed to do what was normally expected from you becasue of drinking?: Never 6. How often during the last year have you needed a first drink in the morning to get yourself going after a heavy drinking session?: Never 7. How often during the last year have you had a feeling of guilt of remorse after drinking?: Never 8. How often during the last year have you been unable to remember what happened the night  before because you had been drinking?: Never 9. Have you or someone else been injured as a result of your drinking?: No 10. Has a relative or friend or a doctor or another health worker been concerned about your drinking or suggested you cut down?: No Alcohol Use Disorder Identification Test Final Score (AUDIT): 0 Alcohol Brief Interventions/Follow-up: AUDIT Score <7 follow-up not indicated Substance Abuse History in the last 12 months:  No. Consequences of Substance Abuse: Negative Previous Psychotropic Medications: Yes  Psychological Evaluations: Yes  Past Medical History:  Past Medical History:  Diagnosis Date  . Bipolar disorder ([place])   . Hypothyroidism     Past Surgical History:  Procedure Laterality Date  . TONSILLECTOMY AND ADENOIDECTOMY     Family History: History reviewed. No pertinent family history. Family Psychiatric  History: She had a great aunt who had bipolar disorder Tobacco Screening: Have you used any form of tobacco in the last 30 days? (Cigarettes, Smokeless Tobacco, Cigars, and/or Pipes): No Social History:  Social History   Substance and Sexual Activity  Alcohol Use Not Currently     Social History   Substance and Sexual Activity  Drug Use Not Currently    Additional Social History: Marital status: Married Number of Years Married: 8 What types of issues is patient dealing with in the relationship?: None reported.  Additional relationship information: Pt reports that this is her 2nd marriage.  First marriage ended in divorce in 1995. Are you sexually active?: Yes What is your sexual orientation?: Heterosexual  Has your sexual activity been affected by drugs, alcohol, medication, or emotional stress?: Pt denies.  Does patient have children?: Yes How many children?: 3 How is patient's relationship with their children?: Pt reports having three adult sons, (ages 64, 10, and 58). Pt reports having a good relationship with her chidlren.                          Allergies:  No Known Allergies Lab Results:  Results for orders placed or performed during the hospital encounter of 05/16/19 (from the past 48 hour(s))  CBC with Differential/Platelet     Status: Abnormal   Collection Time: 05/16/19  6:07 AM  Result Value Ref Range   WBC 10.8 (H) 4.0 - 10.5 K/uL   RBC 3.89 3.87 - 5.11 MIL/uL   Hemoglobin 11.6 (L) 12.0 - 15.0 g/dL   HCT 34.5 (L) 36.0 - 46.0 %   MCV 88.7 80.0 - 100.0 fL   MCH 29.8 26.0 - 34.0 pg   MCHC 33.6 30.0 - 36.0 g/dL   RDW 12.4 11.5 - 15.5 %   Platelets 421 (H) 150 - 400 K/uL   nRBC 0.0 0.0 - 0.2 %   Neutrophils Relative % 62 %   Neutro Abs 6.6 1.7 - 7.7 K/uL   Lymphocytes Relative 31 %   Lymphs Abs 3.4 0.7 - 4.0 K/uL   Monocytes Relative 7 %   Monocytes Absolute 0.8 0.1 - 1.0  K/uL   Eosinophils Relative 0 %   Eosinophils Absolute 0.0 0.0 - 0.5 K/uL   Basophils Relative 0 %   Basophils Absolute 0.0 0.0 - 0.1 K/uL   Immature Granulocytes 0 %   Abs Immature Granulocytes 0.04 0.00 - 0.07 K/uL    Comment: Performed at [facility], [address] [place], [place] [address]  Comprehensive metabolic panel     Status: Abnormal   Collection Time: 05/16/19  6:07 AM  Result Value Ref Range   Sodium 135 135 - 145 mmol/L   Potassium 3.6 3.5 - 5.1 mmol/L   Chloride 103 98 - 111 mmol/L   CO2 21 (L) 22 - 32 mmol/L   Glucose, Bld 129 (H) 70 - 99 mg/dL   BUN 27 (H) 6 - 20 mg/dL   Creatinine, Ser 8.34 (H) 0.44 - 1.00 mg/dL   Calcium 9.2 8.9 - 19.6 mg/dL   Total Protein 6.8 6.5 - 8.1 g/dL   Albumin 4.0 3.5 - 5.0 g/dL   AST 30 15 - 41 U/L   ALT 23 0 - 44 U/L   Alkaline Phosphatase 51 38 - 126 U/L   Total Bilirubin 0.8 0.3 - 1.2 mg/dL   GFR calc non Af Amer 47 (L) >60 mL/min   GFR calc Af Amer 54 (L) >60 mL/min   Anion gap 11 5 - 15    Comment: Performed at [facility], [address] [place], [place] [address]  Ethanol     Status: None   Collection Time: 05/16/19  6:07 AM  Result Value Ref  Range   Alcohol, Ethyl (B) <10 <10 mg/dL    Comment: (NOTE) Lowest detectable limit for serum alcohol is 10 mg/dL. For medical purposes only. Performed at [facility], [address] [place], [place] [address]   Urinalysis, Routine w reflex microscopic     Status: Abnormal   Collection Time: 05/16/19  6:07 AM  Result Value Ref Range   Color, Urine YELLOW (A) YELLOW   APPearance CLEAR (A) CLEAR   Specific Gravity, Urine 1.010 1.005 - 1.030   pH 5.0 5.0 - 8.0   Glucose, UA NEGATIVE NEGATIVE mg/dL   Hgb urine dipstick NEGATIVE NEGATIVE   Bilirubin Urine NEGATIVE NEGATIVE   Ketones, ur 20 (A) NEGATIVE mg/dL   Protein, ur NEGATIVE NEGATIVE mg/dL   Nitrite NEGATIVE NEGATIVE   Leukocytes,Ua NEGATIVE NEGATIVE    Comment: Performed at [facility], [address] [place], [place] [address]  Urine Drug Screen, Qualitative ([facility] only)     Status: None   Collection Time: 05/16/19  6:07 AM  Result Value Ref Range   Tricyclic, Ur Screen NONE DETECTED NONE DETECTED   Amphetamines, Ur Screen NONE DETECTED NONE DETECTED   MDMA (Ecstasy)Ur Screen NONE DETECTED NONE DETECTED   Cocaine Metabolite,Ur [place] NONE DETECTED NONE DETECTED   Opiate, Ur Screen NONE DETECTED NONE DETECTED   Phencyclidine (PCP) Ur S NONE DETECTED NONE DETECTED   Cannabinoid 50 Ng, Ur  NONE DETECTED NONE DETECTED   Barbiturates, Ur Screen NONE DETECTED NONE DETECTED   Benzodiazepine, Ur Scrn NONE DETECTED NONE DETECTED   Methadone Scn, Ur NONE DETECTED NONE DETECTED    Comment: (NOTE) Tricyclics + metabolites, urine    Cutoff 1000 ng/mL Amphetamines + metabolites, urine  Cutoff 1000 ng/mL MDMA (Ecstasy), urine              Cutoff 500 ng/mL Cocaine Metabolite, urine  Cutoff 300 ng/mL Opiate + metabolites, urine        Cutoff 300 ng/mL Phencyclidine (PCP), urine         Cutoff 25 ng/mL Cannabinoid, urine                 Cutoff 50 ng/mL Barbiturates + metabolites, urine  Cutoff 200  ng/mL Benzodiazepine, urine              Cutoff 200 ng/mL Methadone, urine                   Cutoff 300 ng/mL The urine drug screen provides only a preliminary, unconfirmed analytical test result and should not be used for non-medical purposes. Clinical consideration and professional judgment should be applied to any positive drug screen result due to possible interfering substances. A more specific alternate chemical method must be used in order to obtain a confirmed analytical result. Gas chromatography / mass spectrometry (GC/MS) is the preferred confirmat ory method. Performed at [facility], [address] [place], [place] [address]   Acetaminophen level     Status: Abnormal   Collection Time: 05/16/19  6:07 AM  Result Value Ref Range   Acetaminophen (Tylenol), Serum <10 (L) 10 - 30 ug/mL    Comment: (NOTE) Therapeutic concentrations vary significantly. A range of 10-30 ug/mL  may be an effective concentration for many patients. However, some  are best treated at concentrations outside of this range. Acetaminophen concentrations >150 ug/mL at 4 hours after ingestion  and >50 ug/mL at 12 hours after ingestion are often associated with  toxic reactions. Performed at [facility], [address] [place], [place] [address]   Salicylate level     Status: None   Collection Time: 05/16/19  6:07 AM  Result Value Ref Range   Salicylate Lvl <7.0 2.8 - 30.0 mg/dL    Comment: Performed at [facility], [address] [place], [place] [address]  Magnesium     Status: None   Collection Time: 05/16/19  6:07 AM  Result Value Ref Range   Magnesium 1.9 1.7 - 2.4 mg/dL    Comment: Performed at [facility] [address] [place], [place] [address]  T4, free     Status: None   Collection Time: 05/16/19  6:07 AM  Result Value Ref Range   Free T4 0.99 0.61 - 1.12 ng/dL    Comment: (NOTE) Biotin ingestion may interfere with free T4 tests. If the  results are inconsistent with the TSH level, previous test results, or the clinical presentation, then consider biotin interference. If needed, order repeat testing after stopping biotin. Performed at [facility], [address] [place], [place] [address]   SARS Coronavirus 2 [facility] order, Performed in [facility] hospital lab) Nasopharyngeal Nasopharyngeal Swab     Status: None   Collection Time: 05/16/19  1:18 PM   Specimen: Nasopharyngeal Swab  Result Value Ref Range   SARS Coronavirus 2 NEGATIVE NEGATIVE    Comment: (NOTE) If result is NEGATIVE SARS-CoV-2 target nucleic acids are NOT DETECTED. The SARS-CoV-2 RNA is generally detectable in upper and lower  respiratory specimens during the acute phase of infection. The lowest  concentration of SARS-CoV-2 viral copies this assay can detect is 250  copies / mL. A negative result does not preclude SARS-CoV-2 infection  and should not be used as the sole basis for treatment or other  patient management decisions.  A negative result may occur with  improper specimen collection / handling, submission of specimen other  than nasopharyngeal swab, presence of viral mutation(s) within the  areas targeted by this assay, and inadequate number of viral copies  (<250 copies / mL). A negative result must be combined with clinical  observations, patient history, and epidemiological information. If result is POSITIVE SARS-CoV-2 target nucleic acids are DETECTED. The SARS-CoV-2 RNA is generally detectable in upper and lower  respiratory specimens dur ing the acute phase of infection.  Positive  results are indicative of active infection with SARS-CoV-2.  Clinical  correlation with patient history and other diagnostic information is  necessary to determine patient infection status.  Positive results do  not rule out bacterial infection or co-infection with other viruses. If result is PRESUMPTIVE POSTIVE SARS-CoV-2 nucleic acids MAY  BE PRESENT.   A presumptive positive result was obtained on the submitted specimen  and confirmed on repeat testing.  While 2019 novel coronavirus  (SARS-CoV-2) nucleic acids may be present in the submitted sample  additional confirmatory testing may be necessary for epidemiological  and / or clinical management purposes  to differentiate between  SARS-CoV-2 and other Sarbecovirus currently known to infect humans.  If clinically indicated additional testing with an alternate test  methodology [phone]) is advised. The SARS-CoV-2 RNA is generally  detectable in upper and lower respiratory sp ecimens during the acute  phase of infection. The expected result is Negative. Fact Sheet for Patients:  BoilerBrush.com.cy Fact Sheet for Healthcare Providers: https://pope.com/ This test is not yet approved or cleared by the [place] FDA and has been authorized for detection and/or diagnosis of SARS-CoV-2 by FDA under an Emergency Use Authorization (EUA).  This EUA will remain in effect (meaning this test can be used) for the duration of the COVID-19 declaration under Section 564(b)(1) of the Act, 21 U.S.C. section 360bbb-3(b)(1), unless the authorization is terminated or revoked sooner. Performed at [facility], [address] [place], [place] [address]     Blood Alcohol level:  Lab Results  Component Value Date   [facility] <10 05/16/2019   ETH <10 05/15/2019    Metabolic Disorder Labs:  Lab Results  Component Value Date   HGBA1C 5.6 03/05/2018   MPG 114.02 03/05/2018   No results found for: PROLACTIN Lab Results  Component Value Date   CHOL 271 (H) 03/05/2018   TRIG 141 03/05/2018   HDL 34 (L) 03/05/2018   CHOLHDL 8.0 03/05/2018   VLDL 28 03/05/2018   LDLCALC 209 (H) 03/05/2018    Current Medications: Current Facility-Administered Medications  Medication Dose Route Frequency Provider Last Rate Last Dose  .  acetaminophen (TYLENOL) tablet 650 mg  650 mg Oral Q6H PRN Charm Rings, NP      . alum & mag hydroxide-simeth (MAALOX/MYLANTA) 200-200-20 MG/5ML suspension 30 mL  30 mL Oral Q4H PRN Charm Rings, NP      . levothyroxine (SYNTHROID) tablet 50 mcg  50 mcg Oral QAC breakfast Charm Rings, NP   50 mcg at 05/17/19 0559  . magnesium hydroxide (MILK OF MAGNESIA) suspension 30 mL  30 mL Oral Daily PRN Charm Rings, NP      . QUEtiapine (SEROQUEL) tablet 200 mg  200 mg Oral QHS Haliegh Khurana, Jackquline Denmark, MD       PTA Medications: Medications Prior to Admission  Medication Sig Dispense Refill Last Dose  . amLODipine-olmesartan (AZOR) 10-40 MG tablet Take 1 tablet by mouth daily.     . APPLE CIDER VINEGAR PO  Take 1 tablet by mouth daily.     Marland Kitchen levothyroxine (SYNTHROID, LEVOTHROID) 50 MCG tablet Take 1 tablet (50 mcg total) by mouth daily before breakfast. 30 tablet 0   . Multiple Vitamin (MULTIVITAMIN WITH MINERALS) TABS tablet Take 1 tablet by mouth daily.     . TURMERIC PO Take 1 tablet by mouth daily.       Musculoskeletal: Strength & Muscle Tone: within normal limits Gait & Station: normal Patient leans: N/A  Psychiatric Specialty Exam: Physical Exam  Nursing note and vitals reviewed. Constitutional: She appears well-developed and well-nourished.  HENT:  Head: Normocephalic and atraumatic.  Eyes: Pupils are equal, round, and reactive to light. Conjunctivae are normal.  Neck: Normal range of motion.  Cardiovascular: Regular rhythm and normal heart sounds.  Respiratory: Effort normal. No respiratory distress.  GI: Soft.  Musculoskeletal: Normal range of motion.  Neurological: She is alert.  Skin: Skin is warm and dry.  Psychiatric: Her affect is labile. Her speech is tangential. She is agitated. She is not aggressive. Thought content is paranoid. Cognition and memory are impaired. She expresses inappropriate judgment. She expresses no homicidal and no suicidal ideation.    Review of  Systems  Constitutional: Negative.   HENT: Negative.   Eyes: Negative.   Respiratory: Negative.   Cardiovascular: Negative.   Gastrointestinal: Negative.   Musculoskeletal: Negative.   Skin: Negative.   Neurological: Negative.   Psychiatric/Behavioral: Negative for depression, hallucinations, substance abuse and suicidal ideas. The patient is nervous/anxious and has insomnia.     Blood pressure 130/82, pulse 63, temperature 97.6 F (36.4 C), temperature source Oral, resp. rate 17, height  (1.676 m), weight 66 kg, SpO2 100 %.Body mass index is 23.48 kg/m.  General Appearance: Disheveled  Eye Contact:  Fair  Speech:  Clear and Coherent  Volume:  Increased  Mood:  Euphoric  Affect:  Congruent  Thought Process:  Coherent  Orientation:  Full (Time, Place, and Person)  Thought Content:  Illogical, Paranoid Ideation and Rumination  Suicidal Thoughts:  No  Homicidal Thoughts:  No  Memory:  Immediate;   Fair Recent;   Poor Remote;   Fair  Judgement:  Fair  Insight:  Fair  Psychomotor Activity:  Decreased  Concentration:  Concentration: Fair  Recall:  [employer] of Knowledge:  Fair  Language:  Fair  Akathisia:  No  Handed:  Right  AIMS (if indicated):     Assets:  Desire for Improvement Physical Health Social Support  ADL's:  Intact  Cognition:  WNL  Sleep:  Number of Hours: 7.25    Treatment Plan Summary: Daily contact with patient to assess and evaluate symptoms and progress in treatment, Medication management and Plan Patient was started on Zyprexa 10 mg twice a day on admission.  Looking at her labs she has been running high blood sugars.  Between that fact and the fact that she did well on Seroquel in the past I propose that we switch the medicine over to Seroquel at night only.  She is agreeable to this.  Changing Seroquel 200 mg at night.  Educational counseling supportive therapy.  We may try to get in touch with the family.  Likely length of stay just a few days  unless things get worse.  Observation Level/Precautions:  15 minute checks  Laboratory:  Chemistry Profile  Psychotherapy:    Medications:    Consultations:    Discharge Concerns:    Estimated LOS:  Other:     Physician  Treatment Plan for Primary Diagnosis: Bipolar affective disorder, current episode manic with psychotic symptoms (HCC) Long Term Goal(s): Improvement in symptoms so as ready for discharge  Short Term Goals: Ability to verbalize feelings will improve, Ability to demonstrate self-control will improve and Ability to identify and develop effective coping behaviors will improve  Physician Treatment Plan for Secondary Diagnosis: Principal Problem:   Bipolar affective disorder, current episode manic with psychotic symptoms (HCC) Active Problems:   Affective psychosis, bipolar (HCC)  Long Term Goal(s): Improvement in symptoms so as ready for discharge  Short Term Goals: Compliance with prescribed medications will improve  I certify that inpatient services furnished can reasonably be expected to improve the patient's condition.    Mordecai Rasmussen, MD 9/28/20205:03 PM

## 2019-05-17 NOTE — [facility] Suicide Risk Assessment (Signed)
[facility] INPATIENT:  Family/Significant Other Suicide Prevention Education  Suicide Prevention Education:  Education Completed; Mikki Santee, mother, [phone] has been identified by the patient as the family member/significant other with whom the patient will be residing, and identified as the person(s) who will aid the patient in the event of a mental health crisis (suicidal ideations/suicide attempt).  With written consent from the patient, the family member/significant other has been provided the following suicide prevention education, prior to the and/or following the discharge of the patient.  The suicide prevention education provided includes the following:  Suicide risk factors  Suicide prevention and interventions  National Suicide Hotline telephone number  [facility] assessment telephone number  [facility] Emergency Assistance [place] and/or Residential Mobile Crisis Unit telephone number  Request made of family/significant other to:  Remove weapons (e.g., guns, rifles, knives), all items previously/currently identified as safety concern.    Remove drugs/medications (over-the-counter, prescriptions, illicit drugs), all items previously/currently identified as a safety concern.  The family member/significant other verbalizes understanding of the suicide prevention education information provided.  The family member/significant other agrees to remove the items of safety concern listed above.   Rozann Lesches 05/17/2019, 9:38 AM

## 2019-05-17 NOTE — Plan of Care (Signed)
Patient is pleasant and cooperative on approach.Denies SI,HI and AVH.Patient stated that it is good to be here and get the right medicines.Patients goal for today is "be happy and stay positive."Attended groups.Appropriate with staff & peers.Appetite and energy level good.Support and encouragement given.

## 2019-05-17 NOTE — Plan of Care (Signed)
Patient was observed interacting appropriately with staff and peers on the unit. Patient stated she had a good day and was feeling better.   Problem: Education: Goal: Emotional status will improve Outcome: Progressing Goal: Mental status will improve Outcome: Progressing

## 2019-05-17 NOTE — BHH Counselor (Signed)
CSW spoke with the patient's mother, Mikki Santee, mother, [phone].  Mother reports concerns that the patient "seemed stable enough to go home the first time, I am really concerned about who said she could go home. I am a Nurse Practitioner".   Mother reports that the pt had four main contributing factors to her being admitted to the hospital "1. Sleep, she takes a lot of OTC medications for it, 2. She doesn't eat the best, 3. She stresses over her children, 4. 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  Mother reports that "she tries to do everything for everyone but take care of herself".  Mother reports belief that patient's current presentation was a result of "prolonged stress response".    Mother reports that per the patient's niece, pt and husband "may be getting a divorce or just living together for convenience sake".  Mother reports that their has been a family rift where mother, pt, and pt's sister all did not speak to one another but that has improved/    Mother reports concerns that perhaps the patient has a UTI as "she is prone to them and when she has them she gets a little confused like now, she has similar symptoms".  Mother also reports that the patient attends a wellness clinic and she is not sure if patient was prescribed Phentermine, but acknowledges that Phentermine can have an adverse reaction to psychotropic medications.    Assunta Curtis, MSW, LCSW 05/17/2019 9:56 AM

## 2019-05-17 NOTE — B[facility] Suicide Risk Assessment (Signed)
[facility] Admission Suicide Risk Assessment   Nursing information obtained from:  Review of record, Patient Demographic factors:  Caucasian Current Mental Status:  NA Loss Factors:  NA Historical Factors:  NA Risk Reduction Factors:  Positive social support, Positive therapeutic relationship, Living with another person, especially a relative, Employed, Religious beliefs about death, Sense of responsibility to family  Total Time spent with patient: 1 hour Principal Problem: Bipolar affective disorder, current episode manic with psychotic symptoms (HCC) Diagnosis:  Principal Problem:   Bipolar affective disorder, current episode manic with psychotic symptoms (HCC) Active Problems:   Affective psychosis, bipolar (HCC)  Subjective Data: Patient seen chart reviewed.  Patient with a history of bipolar disorder brought into the emergency room by family because of bizarre agitated almost catatonic-like manic symptoms.  Patient today is more lucid.  Denies any suicidal or homicidal thought.  Denies any current hallucinations.  Very agreeable to medication  Continued Clinical Symptoms:  Alcohol Use Disorder Identification Test Final Score (AUDIT): 0 The "Alcohol Use Disorders Identification Test", Guidelines for Use in Primary Care, Second Edition.  World Science writer [facility]). Score between 0-7:  no or low risk or alcohol related problems. Score between 8-15:  moderate risk of alcohol related problems. Score between 16-19:  high risk of alcohol related problems. Score 20 or above:  warrants further diagnostic evaluation for alcohol dependence and treatment.   CLINICAL FACTORS:   Bipolar Disorder:   Mixed State   Musculoskeletal: Strength & Muscle Tone: within normal limits Gait & Station: normal Patient leans: N/A  Psychiatric Specialty Exam: Physical Exam  Nursing note and vitals reviewed. Constitutional: She appears well-developed and well-nourished.  HENT:  Head: Normocephalic and  atraumatic.  Eyes: Pupils are equal, round, and reactive to light. Conjunctivae are normal.  Neck: Normal range of motion.  Cardiovascular: Regular rhythm and normal heart sounds.  Respiratory: Effort normal.  GI: Soft.  Musculoskeletal: Normal range of motion.  Neurological: She is alert.  Skin: Skin is warm and dry.  Psychiatric: Her mood appears anxious. Her speech is rapid and/or pressured. She is not agitated and not aggressive. Thought content is paranoid. Cognition and memory are impaired. She expresses impulsivity. She expresses no homicidal and no suicidal ideation.    Review of Systems  Constitutional: Negative.   HENT: Negative.   Eyes: Negative.   Respiratory: Negative.   Cardiovascular: Negative.   Gastrointestinal: Negative.   Musculoskeletal: Negative.   Skin: Negative.   Neurological: Negative.   Psychiatric/Behavioral: Negative for depression, hallucinations, substance abuse and suicidal ideas. The patient is nervous/anxious and has insomnia.     Blood pressure 130/82, pulse 63, temperature 97.6 F (36.4 C), temperature source Oral, resp. rate 17, height 5\' 6"  (1.676 m), weight 66 kg, SpO2 100 %.Body mass index is 23.48 kg/m.  General Appearance: Casual  Eye Contact:  Fair  Speech:  Clear and Coherent and Pressured  Volume:  Increased  Mood:  Euphoric  Affect:  Congruent  Thought Process:  Disorganized  Orientation:  Full (Time, Place, and Person)  Thought Content:  Illogical, Rumination and Tangential  Suicidal Thoughts:  No  Homicidal Thoughts:  No  Memory:  Immediate;   Fair Recent;   Fair Remote;   Fair  Judgement:  Fair  Insight:  Fair  Psychomotor Activity:  Decreased  Concentration:  Concentration: Fair  Recall:  of Knowledge:  Fair  Language:  Fair  Akathisia:  No  Handed:  Right  AIMS (if indicated):  Assets:  Desire for Improvement Housing Physical Health Social Support  ADL's:  Intact  Cognition:  WNL  Sleep:  Number of  Hours: 7.25      COGNITIVE FEATURES THAT CONTRIBUTE TO RISK:  Loss of executive function    SUICIDE RISK:   Minimal: No identifiable suicidal ideation.  Patients presenting with no risk factors but with morbid ruminations; may be classified as minimal risk based on the severity of the depressive symptoms  PLAN OF CARE: Patient was started on antipsychotic medication.  Already showing improvement.  Psychoeducation about sleep and medication treatment.  Reassess suicidality prior to discharge  I certify that inpatient services furnished can reasonably be expected to improve the patient's condition.   Alethia Berthold, MD 05/17/2019, 4:57 PM

## 2019-05-17 NOTE — Plan of Care (Signed)
  Problem: Education: Goal: Knowledge of [place] [facility] Education information/materials will improve Outcome: Progressing Goal: Emotional status will improve Outcome: Progressing Goal: Mental status will improve Outcome: Progressing  D: Patient has been isolative to self. Denies SI, HI and AVH. Calm and cooperative. Mood is pleasant. Affect is appropriate to circumstance. Contracts for safety A: Continue to monitor for safety R: Safety maintained.

## 2019-05-17 NOTE — Progress Notes (Signed)
D: Patient has been isolative to self. Denies SI, HI and AVH. Calm and cooperative. Mood is pleasant. Affect is appropriate to circumstance. Contracts for safety A: Continue to monitor for safety R: Safety maintained.

## 2019-05-17 NOTE — BHH Group Notes (Signed)
[facility] Group Notes:  (Nursing/MHT/Case Management/Adjunct)  Date:  05/17/2019  Time:  9:28 PM  Type of Therapy:  Group Therapy  Participation Level:  Active  Participation Quality:  Appropriate  Affect:  Appropriate  Cognitive:  Alert  Insight:  Good  Engagement in Group:  Engaged  Modes of Intervention:  Support  Summary of Progress/Problems:  Lindsey Sexton 05/17/2019, 9:28 PM

## 2019-05-17 NOTE — Progress Notes (Signed)
D - Patient was in the day room upon arrival to the unit. Patient was pleasant during assessment denying SI/HI/AVH, pain, with this Probation officer. Patient endorses anxiety and depression rating them 5/10. Patient stated that she had a good day and was hopeful that she wouldn't be down here much longer. Patient given education.   A - Patient compliant with medication administration per MD orders and procedures on the unit. Patient given education. Patient given support and encouragement to be active in his treatment plan. Patient informed to let staff know if there are any issues or problems on the unit.   R - Patient bing monitored Q 15 minutes for safety per unit protocol. Patient remains safe on the unit.

## 2019-05-17 NOTE — BHH Group Notes (Signed)

## 2019-05-18 NOTE — Progress Notes (Signed)
[facility] MD Progress Note  05/18/2019 5:27 PM Lindsey Sexton  MRN:  [id number] Subjective: Patient seen chart reviewed.  Patient followed up in treatment team as well.  Patient is indicating that her symptoms are much better.  Denies any hallucinations.  Denies racing thoughts.  She is presenting with calm and appropriate speech.  No suicidal or homicidal ideation.  Appropriate affect.  She was rather chatty during treatment team today but it did not necessarily seem disorganized or inappropriate.  Patient is expressing a strong desire to follow-up with outpatient treatment in the community.  Seems to be tolerating medication without difficulty Principal Problem: Bipolar affective disorder, current episode manic with psychotic symptoms ([place]) Diagnosis: Principal Problem:   Bipolar affective disorder, current episode manic with psychotic symptoms ([place]) Active Problems:   Affective psychosis, bipolar ([place])  Total Time spent with patient: 30 minutes  Past Psychiatric History: Patient has a history of bipolar disorder with a few separated manic episodes.  Past Medical History:  Past Medical History:  Diagnosis Date  . Bipolar disorder ([place])   . Hypothyroidism     Past Surgical History:  Procedure Laterality Date  . TONSILLECTOMY AND ADENOIDECTOMY     Family History: History reviewed. No pertinent family history. Family Psychiatric  History: None reported Social History:  Social History   Substance and Sexual Activity  Alcohol Use Not Currently     Social History   Substance and Sexual Activity  Drug Use Not Currently    Social History   Socioeconomic History  . Marital status: Married    Spouse name: Not on file  . Number of children: Not on file  . Years of education: Not on file  . Highest education level: Not on file  Occupational History  . Not on file  Social Needs  . Financial resource strain: Not on file  . Food insecurity    Worry: Not on file    Inability: Not on  file  . Transportation needs    Medical: Not on file    Non-medical: Not on file  Tobacco Use  . Smoking status: Never Smoker  . Smokeless tobacco: Never Used  Substance and Sexual Activity  . Alcohol use: Not Currently  . Drug use: Not Currently  . Sexual activity: Yes    Birth control/protection: Surgical  Lifestyle  . Physical activity    Days per week: Not on file    Minutes per session: Not on file  . Stress: Not on file  Relationships  . Social Herbalist on phone: Not on file    Gets together: Not on file    Attends religious service: Not on file    Active member of club or organization: Not on file    Attends meetings of clubs or organizations: Not on file    Relationship status: Not on file  Other Topics Concern  . Not on file  Social History Narrative  . Not on file   Additional Social History:                         Sleep: Fair  Appetite:  Fair  Current Medications: Current Facility-Administered Medications  Medication Dose Route Frequency Provider Last Rate Last Dose  . acetaminophen (TYLENOL) tablet 650 mg  650 mg Oral Q6H PRN Patrecia Pour, NP      . alum & mag hydroxide-simeth (MAALOX/MYLANTA) 200-200-20 MG/5ML suspension 30 mL  30 mL Oral Q4H PRN Reita Cliche,  Herminio Heads, NP      . levothyroxine (SYNTHROID) tablet 50 mcg  50 mcg Oral QAC breakfast Charm Rings, NP   50 mcg at 05/18/19 [phone]  . magnesium hydroxide (MILK OF MAGNESIA) suspension 30 mL  30 mL Oral Daily PRN Charm Rings, NP      . QUEtiapine (SEROQUEL) tablet 200 mg  200 mg Oral QHS Omaree Fuqua, Jackquline Denmark, MD   200 mg at 05/17/19 2135    Lab Results: No results found for this or any previous visit (from the past 48 hour(s)).  Blood Alcohol level:  Lab Results  Component Value Date   ETH <10 05/16/2019   ETH <10 05/15/2019    Metabolic Disorder Labs: Lab Results  Component Value Date   HGBA1C 5.6 03/05/2018   MPG 114.02 03/05/2018   No results found for:  PROLACTIN Lab Results  Component Value Date   CHOL 271 (H) 03/05/2018   TRIG 141 03/05/2018   HDL 34 (L) 03/05/2018   CHOLHDL 8.0 03/05/2018   VLDL 28 03/05/2018   LDLCALC 209 (H) 03/05/2018    Physical Findings: AIMS:  , ,  ,  ,    CIWA:    COWS:     Musculoskeletal: Strength & Muscle Tone: within normal limits Gait & Station: normal Patient leans: N/A  Psychiatric Specialty Exam: Physical Exam  Nursing note and vitals reviewed. Constitutional: She appears well-developed and well-nourished.  HENT:  Head: Normocephalic and atraumatic.  Eyes: Pupils are equal, round, and reactive to light. Conjunctivae are normal.  Neck: Normal range of motion.  Cardiovascular: Regular rhythm and normal heart sounds.  Respiratory: Effort normal. No respiratory distress.  GI: Soft.  Musculoskeletal: Normal range of motion.  Neurological: She is alert.  Skin: Skin is warm and dry.  Psychiatric: She has a normal mood and affect. Her speech is normal and behavior is normal. Judgment and thought content normal. Her mood appears not anxious. Her affect is not blunt. Her speech is not delayed. Cognition and memory are normal.    Review of Systems  Constitutional: Negative.   HENT: Negative.   Eyes: Negative.   Respiratory: Negative.   Cardiovascular: Negative.   Gastrointestinal: Negative.   Musculoskeletal: Negative.   Skin: Negative.   Neurological: Negative.   Psychiatric/Behavioral: Negative.     Blood pressure 130/82, pulse 63, temperature 97.6 F (36.4 C), temperature source Oral, resp. rate 17, height 5\' 6"  (1.676 m), weight 66 kg, SpO2 100 %.Body mass index is 23.48 kg/m.  General Appearance: Casual  Eye Contact:  Fair  Speech:  Normal Rate  Volume:  Normal  Mood:  Euthymic  Affect:  Congruent  Thought Process:  Goal Directed  Orientation:  Full (Time, Place, and Person)  Thought Content:  Logical  Suicidal Thoughts:  No  Homicidal Thoughts:  No  Memory:  Immediate;    Fair Recent;   Fair Remote;   Fair  Judgement:  Fair  Insight:  Fair  Psychomotor Activity:  Normal  Concentration:  Concentration: Fair  Recall:  of Knowledge:  Fair  Language:  Fair  Akathisia:  No  Handed:  Right  AIMS (if indicated):     Assets:  Desire for Improvement Housing Physical Health Social Support  ADL's:  Intact  Cognition:  WNL  Sleep:  Number of Hours: 8.25     Treatment Plan Summary: Daily contact with patient to assess and evaluate symptoms and progress in treatment, Medication management and Plan Patient much  improved.  No obvious psychosis.  Good insight.  Tolerating medicine.  Likely to be discharged tomorrow.  Spoke with treatment team today about options for follow-up care.  Prescriptions will be provided.  Mordecai RasmussenJohn Janiece Scovill, MD 05/18/2019, 5:27 PM

## 2019-05-18 NOTE — Progress Notes (Signed)
D - Patient was in the day room upon arrival to the unit. Patient was pleasant during assessment denying SI/HI/AVH, pain, with this Probation officer. Patient stated her anxiety and depression were much improved and that she is ready to go tomorrow.   A - Patient compliant with medication administration per MD orders and procedures on the unit. Patient given education. Patient given support and encouragement to be active in his treatment plan. Patient informed to let staff know if there are any issues or problems on the unit.   R - Patient bing monitored Q 15 minutes for safety per unit protocol. Patient remains safe on the unit.

## 2019-05-18 NOTE — Progress Notes (Signed)
D: Patient stated slept good last night .Stated appetite  good and energy level   normal. Stated concentration good . Stated on Depression scale  o, hopeless 0 and anxiety 0 .( low 0-10 high) Denies suicidal  homicidal ideations  .  No auditory hallucinations  No pain concerns . Appropriate ADL'S. Interacting with peers and staff. Patient Unaware of information received   Staff  continue to redirect  patient  with information .   Emotional and mental  status improved. Voice of no safety concerns.  Encourage to work on [employer] anxiety   concerns and decision making .   Focus goal  Wanting to go home .  Stated this was a place to come  When she was overloaded , doing to many things at one time. Stated she was going to reach out to her MD that was with her last year at this time . Dr.  Blima Singer.   Patient stated she wanted to remain positive with rest and taking care of  Herself.    A: Encourage patient participation with unit programming . Instruction  Given on  Medication , verbalize understanding.  R: Voice no other concerns. Staff continue to monitor

## 2019-05-18 NOTE — Plan of Care (Signed)
Patient Unaware of information received   Staff  continue to redirect  patient  with information .   Emotional and mental  status improved. Voice of no safety concerns.  Encourage to work on [employer] anxiety   concerns and decision making .   Problem: Education: Goal: Knowledge of [place] [facility] Education information/materials will improve Outcome: Progressing Goal: Emotional status will improve Outcome: Progressing Goal: Mental status will improve Outcome: Progressing   Problem: Safety: Goal: Periods of time without injury will increase Outcome: Progressing   Problem: Education: Goal: Will be free of psychotic symptoms Outcome: Progressing   Problem: Health Behavior/Discharge Planning: Goal: Compliance with prescribed medication regimen will improve Outcome: Progressing  .  Voice of no safety concerns . Compliant  with medication  given . Thought process improving .

## 2019-05-18 NOTE — Tx Team (Signed)
Interdisciplinary Treatment and Diagnostic Plan Update  05/18/2019 Time of Session: 900am Lindsey Sexton MRN: [id number]  Principal Diagnosis: Bipolar affective disorder, current episode manic with psychotic symptoms (HCC)  Secondary Diagnoses: Principal Problem:   Bipolar affective disorder, current episode manic with psychotic symptoms (HCC) Active Problems:   Affective psychosis, bipolar (HCC)   Current Medications:  Current Facility-Administered Medications  Medication Dose Route Frequency Provider Last Rate Last Dose  . acetaminophen (TYLENOL) tablet 650 mg  650 mg Oral Q6H PRN Charm Rings, NP      . alum & mag hydroxide-simeth (MAALOX/MYLANTA) 200-200-20 MG/5ML suspension 30 mL  30 mL Oral Q4H PRN Charm Rings, NP      . levothyroxine (SYNTHROID) tablet 50 mcg  50 mcg Oral QAC breakfast Charm Rings, NP   50 mcg at 05/18/19 [phone]  . magnesium hydroxide (MILK OF MAGNESIA) suspension 30 mL  30 mL Oral Daily PRN Charm Rings, NP      . QUEtiapine (SEROQUEL) tablet 200 mg  200 mg Oral QHS Clapacs, Jackquline Denmark, MD   200 mg at 05/17/19 2135   PTA Medications: Medications Prior to Admission  Medication Sig Dispense Refill Last Dose  . amLODipine-olmesartan (AZOR) 10-40 MG tablet Take 1 tablet by mouth daily.     . APPLE CIDER VINEGAR PO Take 1 tablet by mouth daily.     Marland Kitchen levothyroxine (SYNTHROID, LEVOTHROID) 50 MCG tablet Take 1 tablet (50 mcg total) by mouth daily before breakfast. 30 tablet 0   . Multiple Vitamin (MULTIVITAMIN WITH MINERALS) TABS tablet Take 1 tablet by mouth daily.     . TURMERIC PO Take 1 tablet by mouth daily.       Patient Stressors: Other: Psychosis, Medication Non-compliance  Patient Strengths: Ability for insight Active sense of humor Average or above average intelligence Capable of independent living Metallurgist fund of knowledge Motivation for treatment/growth Physical Health Religious Affiliation Special  hobby/interest Supportive family/friends Work skills  Treatment Modalities: Medication Management, Group therapy, Case management,  1 to 1 session with clinician, Psychoeducation, Recreational therapy.   Physician Treatment Plan for Primary Diagnosis: Bipolar affective disorder, current episode manic with psychotic symptoms (HCC) Long Term Goal(s): Improvement in symptoms so as ready for discharge Improvement in symptoms so as ready for discharge   Short Term Goals: Ability to verbalize feelings will improve Ability to demonstrate self-control will improve Ability to identify and develop effective coping behaviors will improve Compliance with prescribed medications will improve  Medication Management: Evaluate patient's response, side effects, and tolerance of medication regimen.  Therapeutic Interventions: 1 to 1 sessions, Unit Group sessions and Medication administration.  Evaluation of Outcomes: Progressing  Physician Treatment Plan for Secondary Diagnosis: Principal Problem:   Bipolar affective disorder, current episode manic with psychotic symptoms (HCC) Active Problems:   Affective psychosis, bipolar (HCC)  Long Term Goal(s): Improvement in symptoms so as ready for discharge Improvement in symptoms so as ready for discharge   Short Term Goals: Ability to verbalize feelings will improve Ability to demonstrate self-control will improve Ability to identify and develop effective coping behaviors will improve Compliance with prescribed medications will improve     Medication Management: Evaluate patient's response, side effects, and tolerance of medication regimen.  Therapeutic Interventions: 1 to 1 sessions, Unit Group sessions and Medication administration.  Evaluation of Outcomes: Progressing   RN Treatment Plan for Primary Diagnosis: Bipolar affective disorder, current episode manic with psychotic symptoms (HCC) Long Term Goal(s): Knowledge of disease  and therapeutic  regimen to maintain health will improve  Short Term Goals: Ability to remain free from injury will improve, Ability to demonstrate self-control, Ability to verbalize feelings will improve and Compliance with prescribed medications will improve  Medication Management: RN will administer medications as ordered by provider, will assess and evaluate patient's response and provide education to patient for prescribed medication. RN will report any adverse and/or side effects to prescribing provider.  Therapeutic Interventions: 1 on 1 counseling sessions, Psychoeducation, Medication administration, Evaluate responses to treatment, Monitor vital signs and CBGs as ordered, Perform/monitor CIWA, COWS, AIMS and Fall Risk screenings as ordered, Perform wound care treatments as ordered.  Evaluation of Outcomes: Progressing   LCSW Treatment Plan for Primary Diagnosis: Bipolar affective disorder, current episode manic with psychotic symptoms ([place]) Long Term Goal(s): Safe transition to appropriate next level of care at discharge, Engage patient in therapeutic group addressing interpersonal concerns.  Short Term Goals: Engage patient in aftercare planning with referrals and resources and Increase skills for wellness and recovery  Therapeutic Interventions: Assess for all discharge needs, 1 to 1 time with Social worker, Explore available resources and support systems, Assess for adequacy in community support network, Educate family and significant other(s) on suicide prevention, Complete Psychosocial Assessment, Interpersonal group therapy.  Evaluation of Outcomes: Progressing   Progress in Treatment: Attending groups: No. Participating in groups: No. Taking medication as prescribed: Yes. Toleration medication: Yes. Family/Significant other contact made: Yes, individual(s) contacted:  pts mother Patient understands diagnosis: Yes. Discussing patient identified problems/goals with staff: Yes. Medical  problems stabilized or resolved: Yes. Denies suicidal/homicidal ideation: Yes. Issues/concerns per patient self-inventory: No. Other: N/A  New problem(s) identified: No, Describe:  none  New Short Term/Long Term Goal(s): Detox, elimination of AVH/symptoms of psychosis, medication management for mood stabilization; elimination of SI thoughts; development of comprehensive mental wellness/sobriety plan.  Patient Goals:  "to be able to rest and get my thoughts back together"  Discharge Plan or Barriers: SPE pamphlet, Mobile Crisis information, and AA/NA information provided to patient for additional community support and resources. Pt has an appointment scheduled with [facility] on 05/24/2019 at 2:30PM.  Reason for Continuation of Hospitalization: Medication stabilization  Estimated Length of Stay: Tomorrow 05/19/2019  Attendees: Patient: Lindsey Sexton 05/18/2019 10:49 AM  Physician: Dr Weber Cooks MD 05/18/2019 10:49 AM  Nursing: Polly Cobia RN 05/18/2019 10:49 AM  RN Care Manager: 05/18/2019 10:49 AM  Social Worker: Evalina Field LCSW 05/18/2019 10:49 AM  Recreational Therapist:  05/18/2019 10:49 AM  Other: Sanjuana Kava LCSW 05/18/2019 10:49 AM  Other: Assunta Curtis LCSW 05/18/2019 10:49 AM  Other: Marvia Pickles NP 05/18/2019 10:49 AM    Scribe for Treatment Team: Mariann Laster Connie Lasater, LCSW 05/18/2019 10:49 AM

## 2019-05-18 NOTE — Plan of Care (Signed)
Patient pleasant, calm, cooperative, denies SI/HI/AVH  Problem: Education: Goal: Emotional status will improve Outcome: Progressing Goal: Mental status will improve Outcome: Progressing

## 2019-05-18 NOTE — BHH Group Notes (Signed)
  LCSW Group Therapy Note  05/18/2019 12:23 PM   Type of Therapy/Topic:  Group Therapy:  Feelings about Diagnosis  Participation Level:  Active   Description of Group:   This group will allow patients to explore their thoughts and feelings about diagnoses they have received. Patients will be guided to explore their level of understanding and acceptance of these diagnoses. Facilitator will encourage patients to process their thoughts and feelings about the reactions of others to their diagnosis and will guide patients in identifying ways to discuss their diagnosis with significant others in their lives. This group will be process-oriented, with patients participating in exploration of their own experiences, giving and receiving support, and processing challenge from other group members.   Therapeutic Goals: 1. Patient will demonstrate understanding of diagnosis as evidenced by identifying two or more symptoms of the disorder 2. Patient will be able to express two feelings regarding the diagnosis 3. Patient will demonstrate their ability to communicate their needs through discussion and/or role play  Summary of Patient Progress: Pt was appropriate and respectful in group. Pt was able to identify a diagnosis as a conclusion. Pt reported that she is diagnosed with bipolar and she agrees with her diagnosis because she has ups and downs. Pt reported that she does not have them often, but she does have them and knows she does. Pt reported her family is supportive and has learned a lot about her diagnosis and have helped her alone the way. Pt reported the mental health typically has a negative stigma in society because people don't understand it enough.   Therapeutic Modalities:   Cognitive Behavioral Therapy Brief Therapy Feelings Identification    Evalina Field, MSW, LCSW Clinical Social Work 05/18/2019 12:23 PM

## 2019-05-19 MED ORDER — QUETIAPINE FUMARATE 200 MG PO TABS: 200.0000 mg | ORAL_TABLET | Freq: Every day | ORAL | 1 refills | Status: AC

## 2019-05-19 MED ORDER — LEVOTHYROXINE SODIUM 50 MCG PO TABS: 50.0000 ug | ORAL_TABLET | Freq: Every day | ORAL | 1 refills | Status: AC

## 2019-05-19 NOTE — Progress Notes (Signed)
D: Patient is aware of  Discharge this shift . A:Patient denies suicidal /homicidal ideations. Patient received all belongings brought in   No Storage medications. Writer reviewed Discharge Summary, Suicide Risk Assessment, and Transitional Record. Patient also received Prescriptions   from  MD.  Aware  Of follow up appointment . R: Patient left unit with no questions  Or concerns  With husband 

## 2019-05-19 NOTE — Progress Notes (Signed)
Recreation Therapy Notes  Date: 05/19/2019  Time: 9:30 am   Location: Craft room  Behavioral response: Appropriate   Intervention Topic: Emotions  Discussion/Intervention:  Group content on today was focused on emotions. The group identified what emotions are and why it is important to have emotions. Patients expressed some positive and negative emotions. Individuals gave some past experiences on how they normally dealt with emotions in the past. The group described some positive ways to deal with emotions in the future. Patients participated in the intervention "Name the Jerl Santos" where individuals were given a chance to experience different emotions.  Clinical Observations/Feedback:  Patient came to group and define emotions as a reaction. She explained that a common emotion she expresses is stress. Participant expressed that her grandson improves her emotions at times and puts her in a better mood. Individual was social with peers and staff while participating in the intervention.    Lenni Reckner LRT/CTRS         Karelly Dewalt 05/19/2019 11:07 AM

## 2019-05-19 NOTE — Progress Notes (Signed)
  [facility] Adult Case Management Discharge Plan :  Will you be returning to the same living situation after discharge:  Yes,  home At discharge, do you have transportation home?: Yes,  pt will call her husband Do you have the ability to pay for your medications: Yes,  BCBS  Release of information consent forms completed and in the chart;   Patient to Follow up at: Follow-up Information    [place] Follow up on 05/24/2019.   Why: Please attend your appointment on 05/24/2019 at 2:30pm. Thank you! Contact information: [place] [address] [phone]           Next level of care provider has access to [place] and Suicide Prevention discussed: Yes,  SPE completed with pts mother  Have you used any form of tobacco in the last 30 days? (Cigarettes, Smokeless Tobacco, Cigars, and/or Pipes): No  Has patient been referred to the Quitline?: N/A patient is not a smoker  Patient has been referred for addiction treatment: N/A  Delfin Edis, LCSW 05/19/2019, 9:15 AM

## 2019-05-19 NOTE — Discharge Summary (Signed)
Physician Discharge Summary Note  Patient:  Lindsey Sexton is an 48 y.o., female MRN:  [id number] DOB:  1971-08-05 Patient phone:  [phone] (home)  Patient address:   [address] [place] [id number],  Total Time spent with patient: 30 minutes  Date of Admission:  05/16/2019 Date of Discharge: May 19, 2019  Reason for Admission: Patient was admitted because of acute manic symptoms sleeplessness agitation and psychotic thinking  Principal Problem: Bipolar affective disorder, current episode manic with psychotic symptoms [facility](HCC) Discharge Diagnoses: Principal Problem:   Bipolar affective disorder, current episode manic with psychotic symptoms (HCC) Active Problems:   Affective psychosis, bipolar (HCC)   Past Psychiatric History: Patient has a history of bipolar disorder has had several discrete manic episodes most recently a year ago.  Has responded well to medication in the past.  Past Medical History:  Past Medical History:  Diagnosis Date  . Bipolar disorder (HCC)   . Hypothyroidism     Past Surgical History:  Procedure Laterality Date  . TONSILLECTOMY AND ADENOIDECTOMY     Family History: History reviewed. No pertinent family history. Family Psychiatric  History: None reported Social History:  Social History   Substance and Sexual Activity  Alcohol Use Not Currently     Social History   Substance and Sexual Activity  Drug Use Not Currently    Social History   Socioeconomic History  . Marital status: Married    Spouse name: Not on file  . Number of children: Not on file  . Years of education: Not on file  . Highest education level: Not on file  Occupational History  . Not on file  Social Needs  . Financial resource strain: Not on file  . Food insecurity    Worry: Not on file    Inability: Not on file  . Transportation needs    Medical: Not on file    Non-medical: Not on file  Tobacco Use  . Smoking status: Never Smoker  . Smokeless tobacco:  Never Used  Substance and Sexual Activity  . Alcohol use: Not Currently  . Drug use: Not Currently  . Sexual activity: Yes    Birth control/protection: Surgical  Lifestyle  . Physical activity    Days per week: Not on file    Minutes per session: Not on file  . Stress: Not on file  Relationships  . Social Musicianconnections    Talks on phone: Not on file    Gets together: Not on file    Attends religious service: Not on file    Active member of club or organization: Not on file    Attends meetings of clubs or organizations: Not on file    Relationship status: Not on file  Other Topics Concern  . Not on file  Social History Narrative  . Not on file    Hospital Course: Patient was admitted to the psychiatric ward.  15-minute checks employed.  No dangerous behavior observed.  She was cooperative with treatment and showed good insight.  Initially treated with olanzapine I suggested to her that we switch to Seroquel because she already had slightly elevated blood sugars.  Patient also had responded well to Seroquel when she was here last year.  We switched to Seroquel 200 mg at night.  Patient was free of any psychosis not agitated sleeping well.  She was very agreeable to outpatient treatment.  Psychoeducation completed about the importance of medication compliance and follow-up with outpatient treatment.  Also emphasized the importance  of sleeping well and keeping her stress under better control.  She was given the phone number of Dr. Carmon Ginsberg whom she liked very much last year but also has been referred to [facility] for outpatient treatment locally if she desires.  Physical Findings: AIMS:  , ,  ,  ,    CIWA:    COWS:     Musculoskeletal: Strength & Muscle Tone: within normal limits Gait & Station: normal Patient leans: N/A  Psychiatric Specialty Exam: Physical Exam  Nursing note and vitals reviewed. Constitutional: She appears well-developed and well-nourished.  HENT:  Head: Normocephalic  and atraumatic.  Eyes: Pupils are equal, round, and reactive to light. Conjunctivae are normal.  Neck: Normal range of motion.  Cardiovascular: Regular rhythm and normal heart sounds.  Respiratory: Effort normal.  GI: Soft.  Musculoskeletal: Normal range of motion.  Neurological: She is alert.  Skin: Skin is warm and dry.  Psychiatric: She has a normal mood and affect. Her behavior is normal. Judgment and thought content normal.    Review of Systems  Constitutional: Negative.   HENT: Negative.   Eyes: Negative.   Respiratory: Negative.   Cardiovascular: Negative.   Gastrointestinal: Negative.   Musculoskeletal: Negative.   Skin: Negative.   Neurological: Negative.   Psychiatric/Behavioral: Negative.     Blood pressure 130/82, pulse 63, temperature 97.6 F (36.4 C), temperature source Oral, resp. rate 17, height 5\' 6"  (1.676 m), weight 66 kg, SpO2 100 %.Body mass index is 23.48 kg/m.  General Appearance: Casual  Eye Contact:  Good  Speech:  Clear and Coherent  Volume:  Normal  Mood:  Euthymic  Affect:  Congruent  Thought Process:  Goal Directed  Orientation:  Full (Time, Place, and Person)  Thought Content:  Logical  Suicidal Thoughts:  No  Homicidal Thoughts:  No  Memory:  Immediate;   Fair Recent;   Fair Remote;   Fair  Judgement:  Fair  Insight:  Fair  Psychomotor Activity:  Normal  Concentration:  Concentration: Fair  Recall:  Fair  Fund of Knowledge:  Fair  Language:  Fair  Akathisia:  No  Handed:  Right  AIMS (if indicated):     Assets:  Desire for Improvement  ADL's:  Intact  Cognition:  WNL  Sleep:  Number of Hours: 7.75     Have you used any form of tobacco in the last 30 days? (Cigarettes, Smokeless Tobacco, Cigars, and/or Pipes): No  Has this patient used any form of tobacco in the last 30 days? (Cigarettes, Smokeless Tobacco, Cigars, and/or Pipes) Yes, Yes, A prescription for an FDA-approved tobacco cessation medication was offered at discharge and  the patient refused  Blood Alcohol level:  Lab Results  Component Value Date   [facility] <10 05/16/2019   ETH <10 05/15/2019    Metabolic Disorder Labs:  Lab Results  Component Value Date   HGBA1C 5.6 03/05/2018   MPG 114.02 03/05/2018   No results found for: PROLACTIN Lab Results  Component Value Date   CHOL 271 (H) 03/05/2018   TRIG 141 03/05/2018   HDL 34 (L) 03/05/2018   CHOLHDL 8.0 03/05/2018   VLDL 28 03/05/2018   LDLCALC 209 (H) 03/05/2018    See Psychiatric Specialty Exam and Suicide Risk Assessment completed by Attending Physician prior to discharge.  Discharge destination:  Home  Is patient on multiple antipsychotic therapies at discharge:  No   Has Patient had three or more failed trials of antipsychotic monotherapy by history:  No  Recommended Plan for Multiple Antipsychotic Therapies: NA  Discharge Instructions    Diet - low sodium heart healthy   Complete by: As directed    Increase activity slowly   Complete by: As directed      Allergies as of 05/19/2019   No Known Allergies     Medication List    STOP taking these medications   amLODipine-olmesartan 10-40 MG tablet Commonly known as: AZOR   APPLE CIDER VINEGAR PO   multivitamin with minerals Tabs tablet   TURMERIC PO     TAKE these medications     Indication  levothyroxine 50 MCG tablet Commonly known as: SYNTHROID Take 1 tablet (50 mcg total) by mouth daily before breakfast.  Indication: Underactive Thyroid   QUEtiapine 200 MG tablet Commonly known as: SEROQUEL Take 1 tablet (200 mg total) by mouth at bedtime.  Indication: Manic Phase of Manic-Depression      Follow-up Information    [place] Follow up on 05/24/2019.   Why: Please attend your appointment on 05/24/2019 at 2:30pm. Thank you! Contact information: [place] [address] [phone]           Follow-up recommendations:  Activity:  Activity as tolerated Diet:  Regular  diet Other:  Follow-up with outpatient treatment.  Remain on medication.  Sleep well and stay on a regular schedule.  Comments: Patient received prescriptions at discharge  Signed: Alethia Berthold, MD 05/19/2019, 1:46 PM

## 2019-05-19 NOTE — BHH Suicide Risk Assessment (Signed)
[facility] Discharge Suicide Risk Assessment   Principal Problem: Bipolar affective disorder, current episode manic with psychotic symptoms [facility]) Discharge Diagnoses: Principal Problem:   Bipolar affective disorder, current episode manic with psychotic symptoms ([place]) Active Problems:   Affective psychosis, bipolar ([place])   Total Time spent with patient: 30 minutes  Musculoskeletal: Strength & Muscle Tone: within normal limits Gait & Station: normal Patient leans: N/A  Psychiatric Specialty Exam: Review of Systems  Constitutional: Negative.   HENT: Negative.   Eyes: Negative.   Respiratory: Negative.   Cardiovascular: Negative.   Gastrointestinal: Negative.   Musculoskeletal: Negative.   Skin: Negative.   Neurological: Negative.   Psychiatric/Behavioral: Negative.     Blood pressure 130/82, pulse 63, temperature 97.6 F (36.4 C), temperature source Oral, resp. rate 17, height 5\' 6"  (1.676 m), weight 66 kg, SpO2 100 %.Body mass index is 23.48 kg/m.  General Appearance: Casual  Eye Contact::  Good  Speech:  Clear and [id number]  Volume:  Normal  Mood:  Euthymic  Affect:  Congruent  Thought Process:  Goal Directed  Orientation:  Full (Time, Place, and Person)  Thought Content:  Logical  Suicidal Thoughts:  No  Homicidal Thoughts:  No  Memory:  Immediate;   Fair Recent;   Fair Remote;   Fair  Judgement:  Fair  Insight:  Fair  Psychomotor Activity:  Normal  Concentration:  Fair  Recall:  [employer] of Knowledge:Fair  Language: Fair  Akathisia:  No  Handed:  Right  AIMS (if indicated):     Assets:  Desire for Improvement Physical Health Resilience Social Support  Sleep:  Number of Hours: 7.75  Cognition: WNL  ADL's:  Intact   Mental Status Per Nursing Assessment::   On Admission:  NA  Demographic Factors:  Caucasian  Loss Factors: Financial problems/change in socioeconomic status  Historical Factors: Impulsivity  Risk Reduction Factors:   Responsible  for children under 2 years of age, Sense of responsibility to family, Religious beliefs about death, Employed, Living with another person, especially a relative, Positive social support and Positive therapeutic relationship  Continued Clinical Symptoms:  Bipolar Disorder:   Mixed State  Cognitive Features That Contribute To Risk:  None    Suicide Risk:  Minimal: No identifiable suicidal ideation.  Patients presenting with no risk factors but with morbid ruminations; may be classified as minimal risk based on the severity of the depressive symptoms  Follow-up Information    [place] Follow up on 05/24/2019.   Why: Please attend your appointment on 05/24/2019 at 2:30pm. Thank you! Contact information: [place] [address] [phone]           Plan Of Care/Follow-up recommendations:  Activity:  Activity as tolerated Diet:  Diet Other:  Follow-up with outpatient treatment for medication management and therapy.  Alethia Berthold, MD 05/19/2019, 9:26 AM

## 2019-05-19 NOTE — Plan of Care (Signed)
  Problem: Education: Goal: Knowledge of [place] [facility] Education information/materials will improve Outcome: Adequate for Discharge Goal: Emotional status will improve Outcome: Adequate for Discharge Goal: Mental status will improve Outcome: Adequate for Discharge   Problem: Safety: Goal: Periods of time without injury will increase Outcome: Adequate for Discharge   Problem: Education: Goal: Will be free of psychotic symptoms Outcome: Adequate for Discharge   Problem: Health Behavior/Discharge Planning: Goal: Compliance with prescribed medication regimen will improve Outcome: Adequate for Discharge

## 2019-06-24 ENCOUNTER — Other Ambulatory Visit: Payer: Self-pay | Admitting: Psychiatry

## 2019-11-14 ENCOUNTER — Ambulatory Visit: Payer: BLUE CROSS/BLUE SHIELD | Attending: Internal Medicine

## 2019-11-14 DIAGNOSIS — Z23 Encounter for immunization: Secondary | ICD-10-CM

## 2019-11-14 NOTE — Progress Notes (Signed)
   Covid-[facility] Vaccination Clinic  Name:  Nakaiya Beddow    MRN: [id number] DOB: 1970/10/26  11/14/2019  Ms. Mick was observed post Covid-19 immunization for 15 minutes without incident. She was provided with Vaccine Information Sheet and instruction to access the V-Safe system.   Ms. Fryman was instructed to call 911 with any severe reactions post vaccine: Marland Kitchen Difficulty breathing  . Swelling of face and throat  . A fast heartbeat  . A bad rash all over body  . Dizziness and weakness   Immunizations Administered    Name Date Dose VIS Date Route   Pfizer COVID-19 Vaccine 11/14/2019  4:41 PM 0.3 mL 07/30/2019 Intramuscular   Manufacturer: [employer], [employer]   Lot: [id number]   NDC: [address]-1

## 2019-12-05 ENCOUNTER — Ambulatory Visit: Payer: BLUE CROSS/BLUE SHIELD | Attending: Internal Medicine

## 2019-12-05 DIAGNOSIS — Z23 Encounter for immunization: Secondary | ICD-10-CM

## 2019-12-05 NOTE — Progress Notes (Signed)
   Covid-19 Vaccination Clinic  Name:  Fanny Agan    MRN: [id number] DOB: Jul 25, 1971  12/05/2019  Ms. Kundert was observed post Covid-19 immunization for 15 minutes without incident. She was provided with Vaccine Information Sheet and instruction to access the V-Safe system.   Ms. Harvie was instructed to call 911 with any severe reactions post vaccine: Marland Kitchen Difficulty breathing  . Swelling of face and throat  . A fast heartbeat  . A bad rash all over body  . Dizziness and weakness   Immunizations Administered    Name Date Dose VIS Date Route   Pfizer COVID-19 Vaccine 12/05/2019  4:34 PM 0.3 mL 07/30/2019 Intramuscular   Manufacturer: [employer], [employer]   Lot: [id number]   NDC: [address]-6

## 2019-12-15 IMAGING — CR DG CHEST 2V
1 series · 2 of 2 positions shown · non-contrast
Comparison: None.

CLINICAL DATA: 47 y/o F; manic episode with unexplained
rambling/Nikos Prada.

EXAM:
CHEST - 2 VIEW

[Series 1: dg chest 2 view · 0.14mm/px · 2 of 2 slices shown]
[im 1/2]
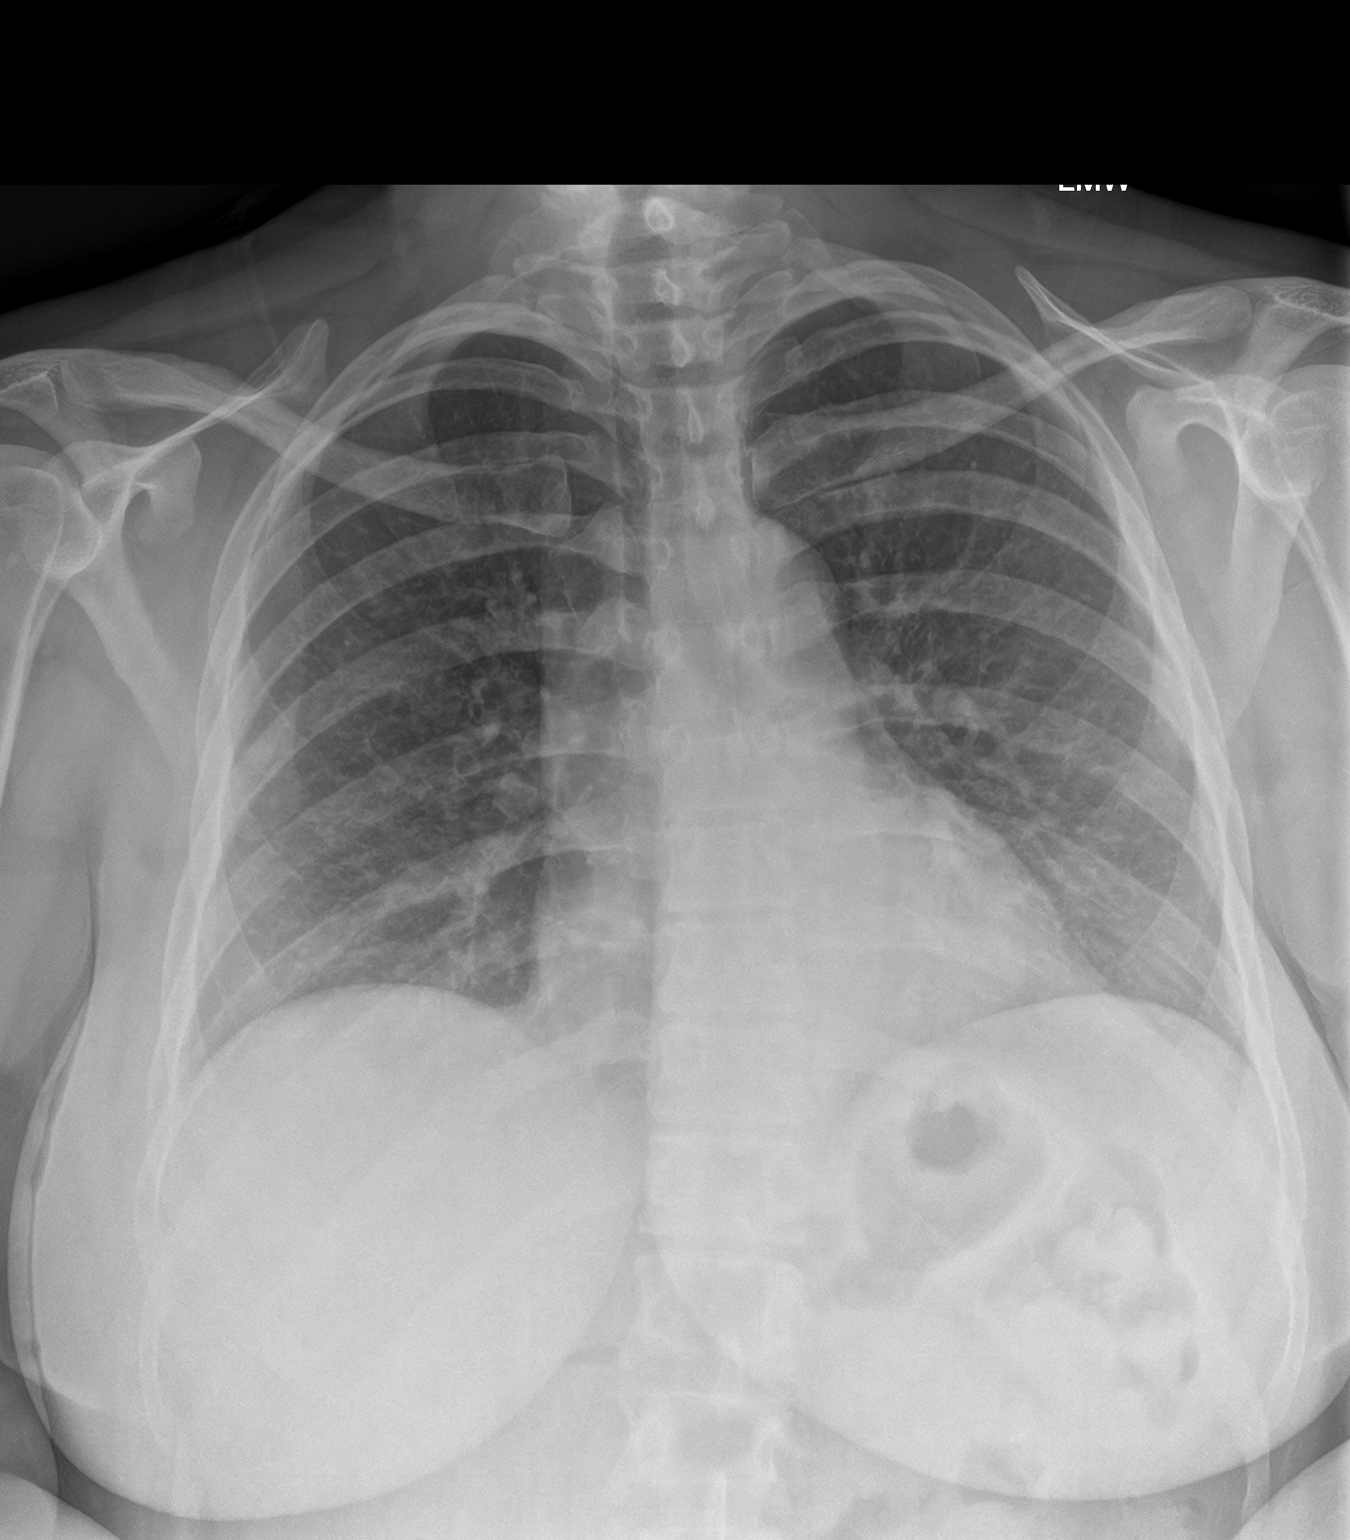
[im 2/2]
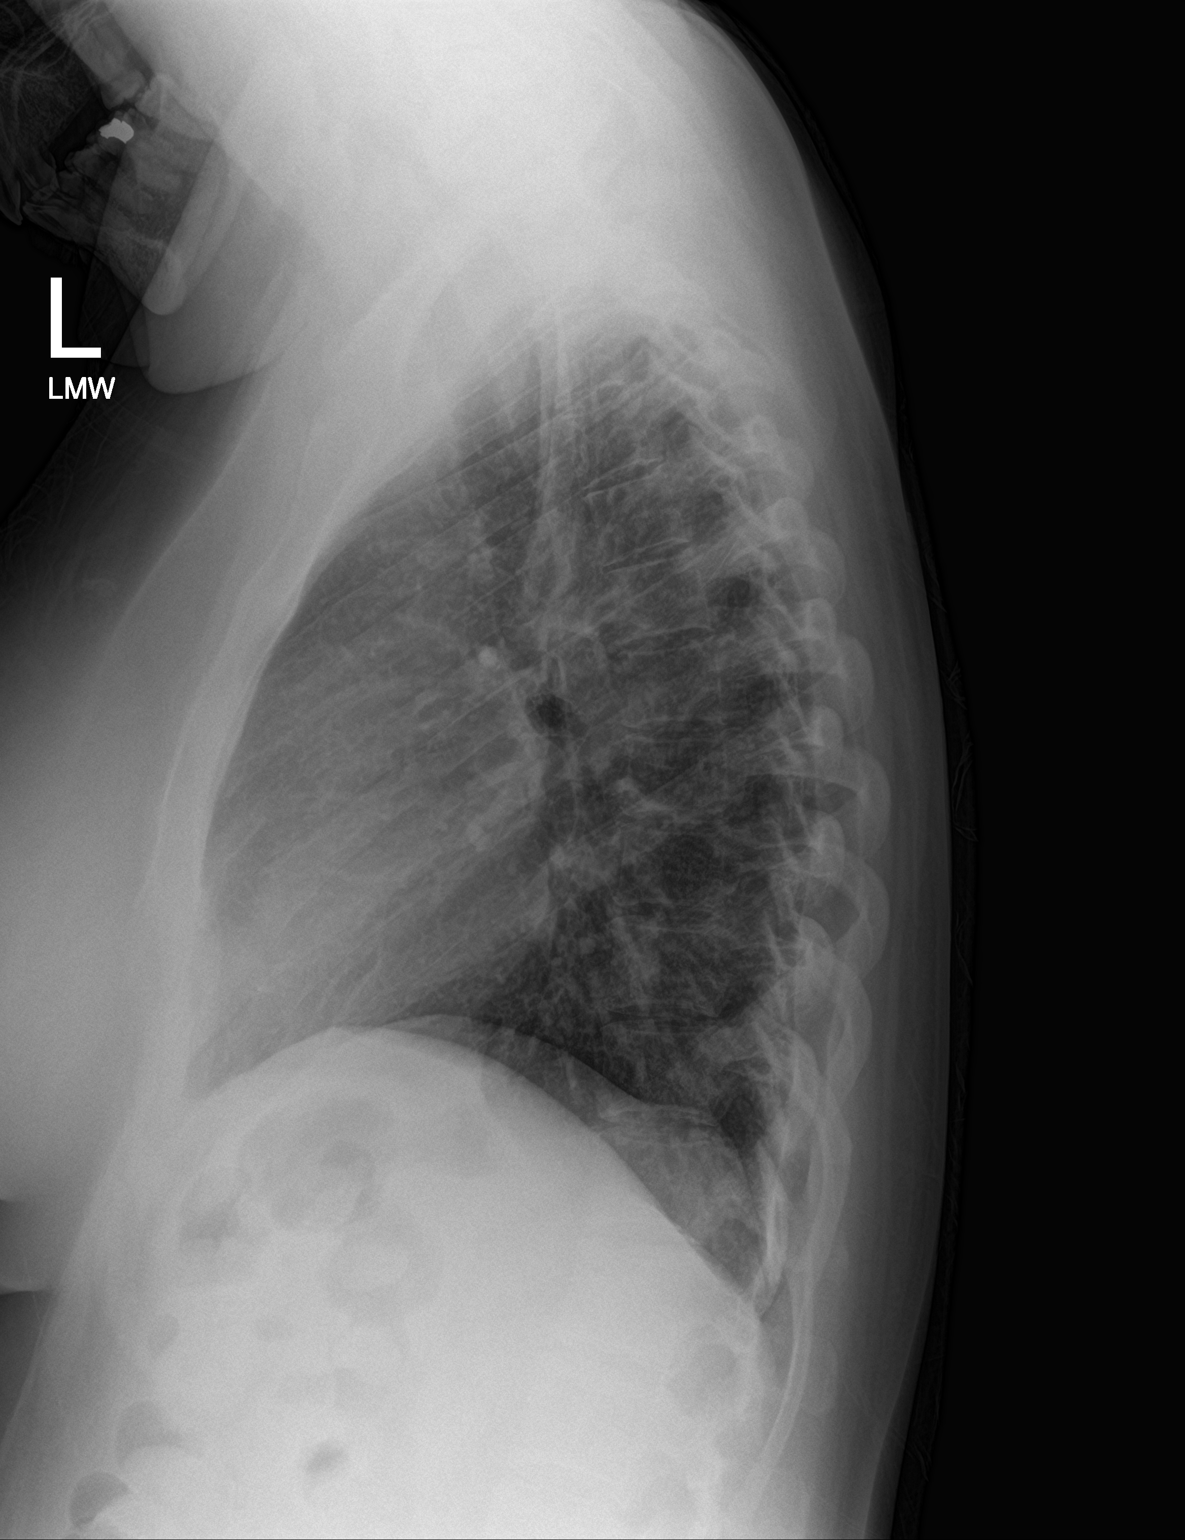

[2 of 2 positions shown; findings below may reference images not displayed]

FINDINGS: The heart size and mediastinal contours are within normal limits.
Both lungs are clear. The visualized skeletal structures are
unremarkable.
IMPRESSION: No active cardiopulmonary disease.

By: Chanellys Padial Ocasio M.D.
